# Patient Record
Sex: Female | Born: 1980 | Race: White | Hispanic: No | Marital: Single | Smoking: Former smoker
Health system: Southern US, Community
[De-identification: ages and names within clinical notes are randomized; demographics above are authoritative.]

## PROBLEM LIST (undated history)

## (undated) DIAGNOSIS — B009 Herpesviral infection, unspecified: Secondary | ICD-10-CM

## (undated) DIAGNOSIS — N390 Urinary tract infection, site not specified: Secondary | ICD-10-CM

## (undated) DIAGNOSIS — F329 Major depressive disorder, single episode, unspecified: Secondary | ICD-10-CM

## (undated) DIAGNOSIS — K219 Gastro-esophageal reflux disease without esophagitis: Secondary | ICD-10-CM

## (undated) DIAGNOSIS — K589 Irritable bowel syndrome without diarrhea: Secondary | ICD-10-CM

## (undated) DIAGNOSIS — I10 Essential (primary) hypertension: Secondary | ICD-10-CM

## (undated) DIAGNOSIS — L509 Urticaria, unspecified: Secondary | ICD-10-CM

## (undated) DIAGNOSIS — F32A Depression, unspecified: Secondary | ICD-10-CM

## (undated) DIAGNOSIS — F419 Anxiety disorder, unspecified: Secondary | ICD-10-CM

## (undated) HISTORY — DX: Irritable bowel syndrome, unspecified: K58.9

## (undated) HISTORY — DX: Gastro-esophageal reflux disease without esophagitis: K21.9

## (undated) HISTORY — DX: Urticaria, unspecified: L50.9

## (undated) HISTORY — PX: COLONOSCOPY: SHX174

---
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
## 1999-08-06 ENCOUNTER — Other Ambulatory Visit: Admission: RE | Admit: 1999-08-06 | Discharge: 1999-08-06 | Payer: Self-pay | Admitting: Family Medicine

## 2000-01-02 ENCOUNTER — Inpatient Hospital Stay (HOSPITAL_COMMUNITY): Admission: AD | Admit: 2000-01-02 | Discharge: 2000-01-02 | Payer: Self-pay | Admitting: Obstetrics & Gynecology

## 2000-04-10 ENCOUNTER — Inpatient Hospital Stay (HOSPITAL_COMMUNITY): Admission: AD | Admit: 2000-04-10 | Discharge: 2000-04-12 | Payer: Self-pay | Admitting: Obstetrics and Gynecology

## 2000-04-15 ENCOUNTER — Encounter: Admission: RE | Admit: 2000-04-15 | Discharge: 2000-05-12 | Payer: Self-pay | Admitting: Obstetrics and Gynecology

## 2000-10-04 ENCOUNTER — Emergency Department (HOSPITAL_COMMUNITY): Admission: EM | Admit: 2000-10-04 | Discharge: 2000-10-04 | Payer: Self-pay | Admitting: Emergency Medicine

## 2003-01-27 ENCOUNTER — Ambulatory Visit (HOSPITAL_COMMUNITY): Admission: RE | Admit: 2003-01-27 | Discharge: 2003-01-27 | Payer: Self-pay | Admitting: *Deleted

## 2003-01-27 ENCOUNTER — Encounter: Payer: Self-pay | Admitting: *Deleted

## 2003-02-20 ENCOUNTER — Ambulatory Visit (HOSPITAL_COMMUNITY): Admission: RE | Admit: 2003-02-20 | Discharge: 2003-02-20 | Payer: Self-pay | Admitting: Internal Medicine

## 2003-02-20 ENCOUNTER — Encounter: Payer: Self-pay | Admitting: Internal Medicine

## 2003-03-27 ENCOUNTER — Encounter: Admission: RE | Admit: 2003-03-27 | Discharge: 2003-03-27 | Payer: Self-pay | Admitting: Gastroenterology

## 2003-03-27 ENCOUNTER — Encounter: Payer: Self-pay | Admitting: Gastroenterology

## 2003-10-23 ENCOUNTER — Other Ambulatory Visit: Admission: RE | Admit: 2003-10-23 | Discharge: 2003-10-23 | Payer: Self-pay | Admitting: Family Medicine

## 2004-10-28 ENCOUNTER — Other Ambulatory Visit: Admission: RE | Admit: 2004-10-28 | Discharge: 2004-10-28 | Payer: Self-pay | Admitting: Family Medicine

## 2005-01-12 ENCOUNTER — Emergency Department (HOSPITAL_COMMUNITY): Admission: EM | Admit: 2005-01-12 | Discharge: 2005-01-12 | Payer: Self-pay | Admitting: Emergency Medicine

## 2005-01-15 ENCOUNTER — Ambulatory Visit (HOSPITAL_COMMUNITY): Admission: RE | Admit: 2005-01-15 | Discharge: 2005-01-15 | Payer: Self-pay | Admitting: Family Medicine

## 2005-05-13 ENCOUNTER — Encounter: Admission: RE | Admit: 2005-05-13 | Discharge: 2005-05-13 | Payer: Self-pay | Admitting: Family Medicine

## 2005-06-16 ENCOUNTER — Emergency Department (HOSPITAL_COMMUNITY): Admission: EM | Admit: 2005-06-16 | Discharge: 2005-06-16 | Payer: Self-pay | Admitting: *Deleted

## 2006-02-09 ENCOUNTER — Other Ambulatory Visit: Admission: RE | Admit: 2006-02-09 | Discharge: 2006-02-09 | Payer: Self-pay | Admitting: Family Medicine

## 2006-04-27 ENCOUNTER — Other Ambulatory Visit: Admission: RE | Admit: 2006-04-27 | Discharge: 2006-04-27 | Payer: Self-pay | Admitting: Family Medicine

## 2007-07-06 ENCOUNTER — Other Ambulatory Visit: Admission: RE | Admit: 2007-07-06 | Discharge: 2007-07-06 | Payer: Self-pay | Admitting: Family Medicine

## 2007-12-21 ENCOUNTER — Ambulatory Visit: Payer: Self-pay | Admitting: Family Medicine

## 2008-01-25 ENCOUNTER — Ambulatory Visit: Payer: Self-pay | Admitting: Family Medicine

## 2009-02-18 ENCOUNTER — Emergency Department (HOSPITAL_COMMUNITY): Admission: EM | Admit: 2009-02-18 | Discharge: 2009-02-19 | Payer: Self-pay | Admitting: Emergency Medicine

## 2010-11-04 ENCOUNTER — Encounter: Payer: Self-pay | Admitting: Emergency Medicine

## 2010-11-20 ENCOUNTER — Emergency Department: Payer: Self-pay | Admitting: Emergency Medicine

## 2011-01-21 LAB — URINE MICROSCOPIC-ADD ON

## 2011-01-21 LAB — DIFFERENTIAL: Neutro Abs: 6.6 10*3/uL (ref 1.7–7.7)

## 2011-01-21 LAB — COMPREHENSIVE METABOLIC PANEL
AST: 20 U/L (ref 0–37)
Calcium: 9.2 mg/dL (ref 8.4–10.5)
Creatinine, Ser: 0.81 mg/dL (ref 0.4–1.2)
Total Bilirubin: 0.6 mg/dL (ref 0.3–1.2)

## 2011-01-21 LAB — CBC
HCT: 44.2 % (ref 36.0–46.0)
MCV: 92.6 fL (ref 78.0–100.0)
Platelets: 230 10*3/uL (ref 150–400)
RBC: 4.78 MIL/uL (ref 3.87–5.11)
RDW: 14.1 % (ref 11.5–15.5)

## 2011-01-21 LAB — WET PREP, GENITAL
Trich, Wet Prep: NONE SEEN
Yeast Wet Prep HPF POC: NONE SEEN

## 2011-01-21 LAB — URINALYSIS, ROUTINE W REFLEX MICROSCOPIC: Leukocytes, UA: NEGATIVE

## 2011-01-21 LAB — GC/CHLAMYDIA PROBE AMP, GENITAL: GC Probe Amp, Genital: NEGATIVE

## 2011-02-01 ENCOUNTER — Emergency Department (HOSPITAL_COMMUNITY): Payer: 59

## 2011-02-01 ENCOUNTER — Emergency Department (HOSPITAL_COMMUNITY)
Admission: EM | Admit: 2011-02-01 | Discharge: 2011-02-01 | Disposition: A | Discharge status: Home / Self Care | Payer: 59 | Attending: Emergency Medicine | Admitting: Emergency Medicine

## 2011-02-01 DIAGNOSIS — R51 Headache: Secondary | ICD-10-CM | POA: Insufficient documentation

## 2011-02-01 DIAGNOSIS — S0003XA Contusion of scalp, initial encounter: Secondary | ICD-10-CM | POA: Insufficient documentation

## 2011-02-01 DIAGNOSIS — IMO0002 Reserved for concepts with insufficient information to code with codable children: Secondary | ICD-10-CM | POA: Insufficient documentation

## 2011-02-01 DIAGNOSIS — H538 Other visual disturbances: Secondary | ICD-10-CM | POA: Insufficient documentation

## 2011-02-01 DIAGNOSIS — H539 Unspecified visual disturbance: Secondary | ICD-10-CM | POA: Insufficient documentation

## 2011-02-01 DIAGNOSIS — S060X0A Concussion without loss of consciousness, initial encounter: Secondary | ICD-10-CM | POA: Insufficient documentation

## 2011-02-01 DIAGNOSIS — Z79899 Other long term (current) drug therapy: Secondary | ICD-10-CM | POA: Insufficient documentation

## 2011-02-01 DIAGNOSIS — W010XXA Fall on same level from slipping, tripping and stumbling without subsequent striking against object, initial encounter: Secondary | ICD-10-CM | POA: Insufficient documentation

## 2011-02-01 DIAGNOSIS — S1093XA Contusion of unspecified part of neck, initial encounter: Secondary | ICD-10-CM | POA: Insufficient documentation

## 2011-02-01 DIAGNOSIS — M542 Cervicalgia: Secondary | ICD-10-CM | POA: Insufficient documentation

## 2011-02-01 DIAGNOSIS — F411 Generalized anxiety disorder: Secondary | ICD-10-CM | POA: Insufficient documentation

## 2011-02-01 DIAGNOSIS — R110 Nausea: Secondary | ICD-10-CM | POA: Insufficient documentation

## 2011-02-01 DIAGNOSIS — Y929 Unspecified place or not applicable: Secondary | ICD-10-CM | POA: Insufficient documentation
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
## 2011-06-27 ENCOUNTER — Other Ambulatory Visit: Payer: Self-pay | Admitting: Gastroenterology

## 2011-06-27 DIAGNOSIS — R1110 Vomiting, unspecified: Secondary | ICD-10-CM

## 2011-07-09 ENCOUNTER — Ambulatory Visit
Admission: RE | Admit: 2011-07-09 | Discharge: 2011-07-09 | Disposition: A | Discharge status: Home / Self Care | Payer: 59 | Source: Ambulatory Visit | Attending: Gastroenterology | Admitting: Gastroenterology

## 2011-07-09 DIAGNOSIS — R1110 Vomiting, unspecified: Secondary | ICD-10-CM

## 2011-08-13 ENCOUNTER — Emergency Department (HOSPITAL_COMMUNITY)
Admission: EM | Admit: 2011-08-13 | Discharge: 2011-08-14 | Disposition: A | Discharge status: Home / Self Care | Payer: 59 | Attending: Emergency Medicine | Admitting: Emergency Medicine

## 2011-08-13 DIAGNOSIS — R9431 Abnormal electrocardiogram [ECG] [EKG]: Secondary | ICD-10-CM | POA: Insufficient documentation

## 2011-08-13 DIAGNOSIS — T50901A Poisoning by unspecified drugs, medicaments and biological substances, accidental (unintentional), initial encounter: Secondary | ICD-10-CM | POA: Insufficient documentation

## 2011-08-13 DIAGNOSIS — R112 Nausea with vomiting, unspecified: Secondary | ICD-10-CM | POA: Insufficient documentation

## 2011-08-13 DIAGNOSIS — R000 Tachycardia, unspecified: Secondary | ICD-10-CM | POA: Insufficient documentation

## 2011-08-13 DIAGNOSIS — T50904A Poisoning by unspecified drugs, medicaments and biological substances, undetermined, initial encounter: Secondary | ICD-10-CM | POA: Insufficient documentation

## 2011-08-13 LAB — RAPID URINE DRUG SCREEN, HOSP PERFORMED
Amphetamines: NOT DETECTED
Benzodiazepines: NOT DETECTED
Cocaine: NOT DETECTED

## 2011-08-13 LAB — DIFFERENTIAL
Basophils Absolute: 0 10*3/uL (ref 0.0–0.1)
Basophils Relative: 0 % (ref 0–1)
Eosinophils Absolute: 0.1 10*3/uL (ref 0.0–0.7)
Eosinophils Relative: 1 % (ref 0–5)
Lymphocytes Relative: 27 % (ref 12–46)
Lymphs Abs: 2.8 10*3/uL (ref 0.7–4.0)
Monocytes Absolute: 0.8 10*3/uL (ref 0.1–1.0)
Monocytes Relative: 7 % (ref 3–12)
Neutro Abs: 6.6 10*3/uL (ref 1.7–7.7)
Neutrophils Relative %: 64 % (ref 43–77)

## 2011-08-13 LAB — COMPREHENSIVE METABOLIC PANEL
ALT: 10 U/L (ref 0–35)
Albumin: 3.3 g/dL — ABNORMAL LOW (ref 3.5–5.2)
Alkaline Phosphatase: 54 U/L (ref 39–117)
BUN: 4 mg/dL — ABNORMAL LOW (ref 6–23)
CO2: 23 mEq/L (ref 19–32)
Creatinine, Ser: 0.65 mg/dL (ref 0.50–1.10)
Potassium: 3.3 mEq/L — ABNORMAL LOW (ref 3.5–5.1)

## 2011-08-13 LAB — CBC
HCT: 38.2 % (ref 36.0–46.0)
Hemoglobin: 13 g/dL (ref 12.0–15.0)
MCH: 32.9 pg (ref 26.0–34.0)
MCHC: 34 g/dL (ref 30.0–36.0)
MCV: 96.7 fL (ref 78.0–100.0)
Platelets: 225 10*3/uL (ref 150–400)
RBC: 3.95 MIL/uL (ref 3.87–5.11)
RDW: 13.8 % (ref 11.5–15.5)
WBC: 10.4 10*3/uL (ref 4.0–10.5)

## 2011-08-13 LAB — URINALYSIS, ROUTINE W REFLEX MICROSCOPIC
Bilirubin Urine: NEGATIVE
Ketones, ur: NEGATIVE mg/dL
Nitrite: NEGATIVE
Protein, ur: NEGATIVE mg/dL
Specific Gravity, Urine: 1.006 (ref 1.005–1.030)

## 2011-08-13 LAB — ACETAMINOPHEN LEVEL: Acetaminophen (Tylenol), Serum: <15 ug/mL (ref 10–30)

## 2011-08-13 LAB — URINE MICROSCOPIC-ADD ON

## 2011-08-13 LAB — SALICYLATE LEVEL: Salicylate Lvl: <2 mg/dL — ABNORMAL LOW (ref 2.8–20.0)

## 2011-08-13 LAB — ETHANOL: Alcohol, Ethyl (B): 104 mg/dL — ABNORMAL HIGH (ref 0–11)

## 2011-08-13 LAB — PREGNANCY, URINE: Preg Test, Ur: NEGATIVE

## 2011-08-31 ENCOUNTER — Emergency Department (HOSPITAL_COMMUNITY): Payer: 59

## 2011-08-31 ENCOUNTER — Emergency Department (HOSPITAL_COMMUNITY)
Admission: EM | Admit: 2011-08-31 | Discharge: 2011-08-31 | Disposition: A | Discharge status: Home / Self Care | Payer: 59 | Attending: Emergency Medicine | Admitting: Emergency Medicine

## 2011-08-31 ENCOUNTER — Encounter: Payer: Self-pay | Admitting: *Deleted

## 2011-08-31 DIAGNOSIS — J3489 Other specified disorders of nose and nasal sinuses: Secondary | ICD-10-CM | POA: Insufficient documentation

## 2011-08-31 DIAGNOSIS — R42 Dizziness and giddiness: Secondary | ICD-10-CM | POA: Insufficient documentation

## 2011-08-31 DIAGNOSIS — R51 Headache: Secondary | ICD-10-CM | POA: Insufficient documentation

## 2011-08-31 DIAGNOSIS — R000 Tachycardia, unspecified: Secondary | ICD-10-CM | POA: Insufficient documentation

## 2011-08-31 DIAGNOSIS — IMO0001 Reserved for inherently not codable concepts without codable children: Secondary | ICD-10-CM | POA: Insufficient documentation

## 2011-08-31 DIAGNOSIS — J343 Hypertrophy of nasal turbinates: Secondary | ICD-10-CM | POA: Insufficient documentation

## 2011-08-31 DIAGNOSIS — F411 Generalized anxiety disorder: Secondary | ICD-10-CM | POA: Insufficient documentation

## 2011-08-31 DIAGNOSIS — R05 Cough: Secondary | ICD-10-CM | POA: Insufficient documentation

## 2011-08-31 DIAGNOSIS — R112 Nausea with vomiting, unspecified: Secondary | ICD-10-CM | POA: Insufficient documentation

## 2011-08-31 DIAGNOSIS — R5381 Other malaise: Secondary | ICD-10-CM | POA: Insufficient documentation

## 2011-08-31 DIAGNOSIS — E860 Dehydration: Secondary | ICD-10-CM | POA: Insufficient documentation

## 2011-08-31 DIAGNOSIS — R059 Cough, unspecified: Secondary | ICD-10-CM | POA: Insufficient documentation

## 2011-08-31 DIAGNOSIS — J069 Acute upper respiratory infection, unspecified: Secondary | ICD-10-CM | POA: Insufficient documentation

## 2011-08-31 DIAGNOSIS — R070 Pain in throat: Secondary | ICD-10-CM | POA: Insufficient documentation

## 2011-08-31 LAB — URINALYSIS, ROUTINE W REFLEX MICROSCOPIC
Glucose, UA: NEGATIVE mg/dL
Glucose, UA: NEGATIVE mg/dL
Ketones, ur: NEGATIVE mg/dL
Nitrite: NEGATIVE
Protein, ur: NEGATIVE mg/dL
Protein, ur: NEGATIVE mg/dL
Specific Gravity, Urine: 1.013 (ref 1.005–1.030)
Urobilinogen, UA: 0.2 mg/dL (ref 0.0–1.0)
pH: 6.5 (ref 5.0–8.0)

## 2011-08-31 LAB — URINE MICROSCOPIC-ADD ON

## 2011-08-31 LAB — POCT PREGNANCY, URINE: Preg Test, Ur: NEGATIVE

## 2011-08-31 LAB — POCT I-STAT, CHEM 8
BUN: 5 mg/dL — ABNORMAL LOW (ref 6–23)
Chloride: 106 mEq/L (ref 96–112)
Creatinine, Ser: 0.9 mg/dL (ref 0.50–1.10)
Potassium: 3 mEq/L — ABNORMAL LOW (ref 3.5–5.1)
Sodium: 140 mEq/L (ref 135–145)

## 2011-08-31 MED ORDER — POTASSIUM CHLORIDE CRYS ER 20 MEQ PO TBCR
40.0000 meq | EXTENDED_RELEASE_TABLET | Freq: Once | ORAL | Status: AC
  Administered 2011-08-31: 40 meq via ORAL
  Filled 2011-08-31: qty 1

## 2011-08-31 MED ORDER — SODIUM CHLORIDE 0.9 % IV BOLUS (SEPSIS)
1000.0000 mL | Freq: Once | INTRAVENOUS | Status: AC
  Administered 2011-08-31: 1000 mL via INTRAVENOUS

## 2011-08-31 MED ORDER — ONDANSETRON HCL 4 MG/2ML IJ SOLN
4.0000 mg | Freq: Once | INTRAMUSCULAR | Status: AC
  Administered 2011-08-31: 4 mg via INTRAVENOUS
  Filled 2011-08-31: qty 2

## 2011-08-31 MED ORDER — KETOROLAC TROMETHAMINE 30 MG/ML IJ SOLN
30.0000 mg | Freq: Once | INTRAMUSCULAR | Status: AC
  Administered 2011-08-31: 30 mg via INTRAVENOUS
  Filled 2011-08-31: qty 1

## 2011-08-31 NOTE — ED Provider Notes (Signed)
History     CSN: [id number] Arrival date & time: 08/31/2011  6:48 PM   First MD Initiated Contact with Patient 08/31/11 1932      Chief Complaint  Patient presents with  . Generalized Body Aches  . URI    (Consider location/radiation/quality/duration/timing/severity/associated sxs/prior treatment) HPI The patient is a 30 year old female who presents today for evaluation of upper respiratory symptoms as well as generalized aches and pains. Patient had one episode of nausea and vomiting. Patient also felt lightheaded medially prior to being brought back for evaluation here.  She was afebrile but initially tachycardic to the 110-120. Patient describes having coughing as well as nasal congestion, sore throat, and mild headache. Her one episode of emesis was following coughing.  She does not know of any sick contacts and says her temperature was as high as 100.5 at home yesterday evening. There no other associated or modifying factors. History reviewed. No pertinent past medical history.  History reviewed. No pertinent past surgical history.  History reviewed. No pertinent family history.  History  Substance Use Topics  . Smoking status: Current Everyday Smoker -- 1.0 packs/day    Types: Cigarettes  . Smokeless tobacco: Not on file  . Alcohol Use: Yes     daily    OB History    Grav Para Term Preterm Abortions TAB SAB Ect Mult Living                  Review of Systems  Constitutional: Positive for fever and fatigue.  HENT: Positive for congestion and sore throat.   Eyes: Negative.   Respiratory: Positive for cough.   Cardiovascular: Negative.   Gastrointestinal: Positive for nausea and vomiting.  Genitourinary: Negative.   Musculoskeletal: Positive for myalgias.  Skin: Negative.  Negative for rash.  Neurological: Positive for headaches.  Hematological: Negative.   Psychiatric/Behavioral: Negative.   All other systems reviewed and are negative.    Allergies  Review  of patient's allergies indicates no known allergies.  Home Medications   Current Outpatient Rx  Name Route Sig Dispense Refill  . CLONAZEPAM 1 MG PO TABS Oral Take 1 mg by mouth 3 (three) times daily as needed.      Marland Kitchen DICYCLOMINE HCL 20 MG PO TABS Oral Take 20 mg by mouth daily.      Marland Kitchen OMEPRAZOLE 20 MG PO CPDR Oral Take 20 mg by mouth 2 (two) times daily.        BP 128/68  Pulse 72  Temp(Src) 99.5 F (37.5 C) (Oral)  Resp 30  SpO2 100%  Physical Exam  Nursing note and vitals reviewed. Constitutional: She is oriented to person, place, and time. She appears well-developed and well-nourished. No distress.  HENT:  Head: Normocephalic and atraumatic.  Nose: Mucosal edema present.  Eyes: Conjunctivae and EOM are normal. Pupils are equal, round, and reactive to light.  Neck: Normal range of motion.  Cardiovascular: Normal rate, regular rhythm and normal heart sounds.  Exam reveals no gallop and no friction rub.   No murmur heard. Pulmonary/Chest: Effort normal and breath sounds normal. No respiratory distress. She has no wheezes. She has no rales.  Abdominal: Soft. Bowel sounds are normal. She exhibits no distension. There is no tenderness. There is no rebound and no guarding.  Musculoskeletal: Normal range of motion.  Neurological: She is alert and oriented to person, place, and time. No cranial nerve deficit. She exhibits normal muscle tone. Coordination normal.  Skin: Skin is warm and dry. No rash  noted.  Psychiatric:       Anxious    ED Course  Procedures (including critical care time)  Labs Reviewed  URINALYSIS, ROUTINE W REFLEX MICROSCOPIC - Abnormal; Notable for the following:    Appearance CLOUDY (*)    Hgb urine dipstick MODERATE (*)    Leukocytes, UA MODERATE (*)    All other components within normal limits  URINE MICROSCOPIC-ADD ON - Abnormal; Notable for the following:    Squamous Epithelial / LPF MANY (*)    Bacteria, UA MANY (*)    Casts HYALINE CASTS (*)     All other components within normal limits  POCT I-STAT, CHEM 8 - Abnormal; Notable for the following:    Potassium 3.0 (*)    BUN 5 (*)    Calcium, Ion 1.03 (*)    All other components within normal limits  URINALYSIS, ROUTINE W REFLEX MICROSCOPIC - Abnormal; Notable for the following:    Hgb urine dipstick SMALL (*)    Ketones, ur 15 (*)    Leukocytes, UA TRACE (*)    All other components within normal limits  URINE MICROSCOPIC-ADD ON - Abnormal; Notable for the following:    Squamous Epithelial / LPF FEW (*)    Bacteria, UA FEW (*)    Casts HYALINE CASTS (*)    All other components within normal limits  POCT PREGNANCY, URINE  I-STAT, CHEM 8   Dg Chest 2 View  08/31/2011  *RADIOLOGY REPORT*  Clinical Data: Chest pain.  Cough.  Shortness of breath.  Fever.  CHEST - 2 VIEW  Comparison: 01/15/2005  Findings: Cardiac and mediastinal contours appear normal.  The lungs appear clear.  No pleural effusion is identified.  IMPRESSION:  No significant abnormality identified.  Original Report Authenticated By: Dellia Cloud, M.D.     1. URI, acute   2. Dehydration       MDM  The patient was evaluated by myself. Given her symptoms patient did have a chest x-ray. This showed no signs of pneumonia. Patient also had urinalysis and urine pregnancy test. Urine pregnancy was negative. Initial urinalysis was contaminated. Patient had repeat performed. This did not show any signs of infection. There were some signs of dehydration. Patient also had a renal panel. She had some mild hypokalemia. This was replaced orally. Patient received a liter of normal saline IV bolus as well as 30 mg of Toradol IV. With this she felt better. Patient was told that she can continue to take over-the-counter medications for her symptoms. She is welcome to return if she has any other emergent concerns. She was encouraged to drink plenty of fluids.        Cyndra Numbers, MD 08/31/11 [id number]

## 2011-08-31 NOTE — ED Notes (Signed)
Pt has been having body aches, URI symptoms.  Pt has also been having fever and chills.  Pt has been having a severe HA with this also and has intermittently been feeling like she will have to pass out.  Pt vomiting in triage.  No sob

## 2011-08-31 NOTE — ED Notes (Signed)
Patient transported to X-ray 

## 2011-10-23 ENCOUNTER — Telehealth (INDEPENDENT_AMBULATORY_CARE_PROVIDER_SITE_OTHER): Payer: Self-pay | Admitting: Surgery

## 2011-10-23 NOTE — Telephone Encounter (Signed)
This patient is scheduled to see Dr. Magnus Ivan for a polyp in the GB.  She is having problems getting her records from her doctor, they say they will not release her records unless she has a physical which she has already scheduled.  My question is can you pull the information you need from the system or does her doctor's office have to fax it in?

## 2011-11-11 ENCOUNTER — Ambulatory Visit (INDEPENDENT_AMBULATORY_CARE_PROVIDER_SITE_OTHER): Payer: 59 | Admitting: Surgery

## 2011-11-11 ENCOUNTER — Encounter (INDEPENDENT_AMBULATORY_CARE_PROVIDER_SITE_OTHER): Payer: Self-pay | Admitting: Surgery

## 2011-11-11 VITALS — BP 126/90 | HR 110 | Temp 98.4°F | Resp 18 | Ht 64.0 in | Wt 145.0 lb

## 2011-11-11 DIAGNOSIS — K8020 Calculus of gallbladder without cholecystitis without obstruction: Secondary | ICD-10-CM

## 2011-11-11 NOTE — Progress Notes (Signed)
Patient ID: Nicole [name], female   DOB: January 04, 1981, 31 y.o.   MRN: [id number]  Chief Complaint  Patient presents with  . Abdominal Pain    new pt gallstones    HPI Nicole [name] is a 31 y.o. female.She is a self referral. For over a year now she has been having intermittent epigastric and right upper quadrant abdominal pain nausea and projectile vomiting. It occurs at different random times but occasionally is associated with fatty meals. She denies any jaundice. She has had a workup including upper endoscopy and ultrasound. The pain can be sharp in nature. They can also be cramping. HPI  Past Medical History  Diagnosis Date  . GERD (gastroesophageal reflux disease)   . IBS (irritable bowel syndrome)     Past Surgical History  Procedure Date  . Colonoscopy     Family History  Problem Relation Age of Onset  . Hypertension Father   . Cancer Maternal Uncle     esophageal  . Hypertension Maternal Grandmother     Social History History  Substance Use Topics  . Smoking status: Current Everyday Smoker -- 1.0 packs/day    Types: Cigarettes  . Smokeless tobacco: Not on file  . Alcohol Use: Yes     daily    No Known Allergies  Current Outpatient Prescriptions  Medication Sig Dispense Refill  . clonazePAM (KLONOPIN) 1 MG tablet Take 1 mg by mouth 3 (three) times daily as needed.        . dicyclomine (BENTYL) 20 MG tablet Take 20 mg by mouth daily.        . diphenoxylate-atropine (LOMOTIL) 2.5-0.025 MG per tablet Take 1 tablet by mouth 4 (four) times daily as needed.      Marland Kitchen GILDESS FE 1.5/30 1.5-30 MG-MCG tablet daily.      . LamoTRIgine (LAMICTAL ODT) 50 MG TBDP Take by mouth as directed.      . lamoTRIgine (LAMICTAL) 100 MG tablet Take 100 mg by mouth as directed.      . lamoTRIgine (LAMICTAL) 25 MG tablet Take 25 mg by mouth as directed.      . nitrofurantoin, macrocrystal-monohydrate, (MACROBID) 100 MG capsule Ad lib.      Marland Kitchen omeprazole (PRILOSEC) 20 MG capsule Take 20 mg  by mouth 2 (two) times daily.        . ondansetron (ZOFRAN) 8 MG tablet Ad lib.      . pantoprazole (PROTONIX) 40 MG tablet daily.      . risperiDONE (RISPERDAL) 1 MG tablet daily.        Review of Systems Review of Systems  Constitutional: Negative for fever, chills and unexpected weight change.  HENT: Negative for hearing loss, congestion, sore throat, trouble swallowing and voice change.   Eyes: Negative for visual disturbance.  Respiratory: Negative for cough and wheezing.   Cardiovascular: Negative for chest pain, palpitations and leg swelling.  Gastrointestinal: Positive for nausea, vomiting, abdominal pain, diarrhea and abdominal distention. Negative for constipation, blood in stool and anal bleeding.  Genitourinary: Negative for hematuria, vaginal bleeding and difficulty urinating.  Musculoskeletal: Negative for arthralgias.  Skin: Negative for rash and wound.  Neurological: Negative for seizures, syncope and headaches.  Hematological: Negative for adenopathy. Does not bruise/bleed easily.  Psychiatric/Behavioral: Negative for confusion.    Blood pressure 126/90, pulse 110, temperature 98.4 F (36.9 C), temperature source Temporal, resp. rate 18, height 5\' 4"  (1.626 m), weight 145 lb (65.772 kg).  Physical Exam Physical Exam  Constitutional: She is oriented to  person, place, and time. She appears well-developed and well-nourished. No distress.  HENT:  Head: Normocephalic and atraumatic.  Right Ear: External ear normal.  Left Ear: External ear normal.  Nose: Nose normal.  Mouth/Throat: Oropharynx is clear and moist. No oropharyngeal exudate.  Eyes: Conjunctivae are normal. Pupils are equal, round, and reactive to light. Right eye exhibits no discharge. Left eye exhibits no discharge. No scleral icterus.  Neck: Normal range of motion. Neck supple. No tracheal deviation present. No thyromegaly present.  Cardiovascular: Normal rate, regular rhythm, normal heart sounds and  intact distal pulses.   No murmur heard. Pulmonary/Chest: Effort normal and breath sounds normal. No respiratory distress. She has no wheezes. She has no rales.  Abdominal: Soft. Bowel sounds are normal. There is tenderness. There is guarding.       Tenderness and guarding or in the right upper quadrant  Musculoskeletal: Normal range of motion. She exhibits no edema and no tenderness.  Lymphadenopathy:    She has no cervical adenopathy.  Neurological: She is alert and oriented to person, place, and time.  Skin: Skin is warm and dry. No rash noted. She is not diaphoretic. No erythema.  Psychiatric: Her behavior is normal. Judgment normal.    Data Reviewed Have an ultrasound showing her to have a gallbladder polyp. She has had an ultrasound in the past was suggest a stone  Assessment    Symptomatic cholelithiasis and possible chronic cholecystitis    Plan    At this point she wishes to proceed with cholecystectomy which I feel is very reasonable. She does have a lot of stressors which may be contributing to the problem. I agree however that laparoscopic cholecystectomy is needed. I discussed the procedure with her in detail. I gave her literature regarding it. We discussed the risk of surgery which includes but is not limited to bleeding, infection, injury to shrink structures, bile leak, bile duct injury, need to convert to an open procedure, and the chance this may not resolve any of her symptoms. She understands and wishes to proceed. Likelihood of success is good       Bram Hottel A 11/11/2011, 11:51 AM

## 2011-11-12 ENCOUNTER — Encounter (INDEPENDENT_AMBULATORY_CARE_PROVIDER_SITE_OTHER): Payer: Self-pay

## 2011-11-13 ENCOUNTER — Telehealth (INDEPENDENT_AMBULATORY_CARE_PROVIDER_SITE_OTHER): Payer: Self-pay | Admitting: General Surgery

## 2011-11-13 NOTE — Telephone Encounter (Signed)
Patient called with questions about FMLA and short term disability process. She also had some questions about speaking with anesthesia prior to surgery and what happens at pre-surgical testing.  I answered her questions and she will call back if she has anymore.

## 2011-11-26 ENCOUNTER — Encounter (HOSPITAL_COMMUNITY): Payer: Self-pay | Admitting: Pharmacy Technician

## 2011-11-26 ENCOUNTER — Encounter (HOSPITAL_COMMUNITY)
Admission: RE | Admit: 2011-11-26 | Discharge: 2011-11-26 | Disposition: A | Discharge status: Home / Self Care | Payer: 59 | Source: Ambulatory Visit | Attending: Surgery | Admitting: Surgery

## 2011-11-26 ENCOUNTER — Encounter (HOSPITAL_COMMUNITY): Payer: Self-pay

## 2011-11-26 HISTORY — DX: Anxiety disorder, unspecified: F41.9

## 2011-11-26 HISTORY — DX: Urinary tract infection, site not specified: N39.0

## 2011-11-26 HISTORY — DX: Major depressive disorder, single episode, unspecified: F32.9

## 2011-11-26 HISTORY — DX: Depression, unspecified: F32.A

## 2011-11-26 HISTORY — DX: Herpesviral infection, unspecified: B00.9

## 2011-11-26 LAB — COMPREHENSIVE METABOLIC PANEL
ALT: 8 U/L (ref 0–35)
AST: 14 U/L (ref 0–37)
Albumin: 3.2 g/dL — ABNORMAL LOW (ref 3.5–5.2)
Alkaline Phosphatase: 60 U/L (ref 39–117)
Calcium: 8.9 mg/dL (ref 8.4–10.5)
Potassium: 3.8 mEq/L (ref 3.5–5.1)
Sodium: 139 mEq/L (ref 135–145)
Total Protein: 6.3 g/dL (ref 6.0–8.3)

## 2011-11-26 LAB — CBC
HCT: 35.4 % — ABNORMAL LOW (ref 36.0–46.0)
MCHC: 33.9 g/dL (ref 30.0–36.0)
Platelets: 249 10*3/uL (ref 150–400)
RDW: 16.7 % — ABNORMAL HIGH (ref 11.5–15.5)
WBC: 10.7 10*3/uL — ABNORMAL HIGH (ref 4.0–10.5)

## 2011-11-26 LAB — HCG, SERUM, QUALITATIVE: Preg, Serum: NEGATIVE

## 2011-11-26 LAB — SURGICAL PCR SCREEN: Staphylococcus aureus: NEGATIVE

## 2011-11-26 NOTE — Pre-Procedure Instructions (Signed)
[address] RENEISHA STILLEY  11/26/2011   Your procedure is scheduled on:  12/05/11  Report to [facility] at 5:30 AM.  Call this number if you have problems the morning of surgery: [phone]   Remember: Discontinue Aspirin, Coumadin, Plavix, Effient and herbal medications.   Do not eat food:After Midnight.  May have clear liquids: up to 4 Hours before arrival (1:30 AM).  Clear liquids include soda, tea, black coffee, apple or grape juice, broth.  Take these medicines the morning of surgery with A SIP OF WATER: Klonopin, Gildess, Lamictal, Risperdal, Prilosec   Do not wear jewelry, make-up or nail polish.  Do not wear lotions, powders, or perfumes. You may wear deodorant.  Do not shave 48 hours prior to surgery.  Do not bring valuables to the hospital.  Contacts, dentures or bridgework may not be worn into surgery.  Leave suitcase in the car. After surgery it may be brought to your room.  For patients admitted to the hospital, checkout time is 11:00 AM the day of discharge.   Patients discharged the day of surgery will not be allowed to drive home.  Name and phone number of your driver: mother Maija Biggers [phone]  Special Instructions: CHG Shower Use Special Wash: 1/2 bottle night before surgery and 1/2 bottle morning of surgery.   Please read over the following fact sheets that you were given: Pain Booklet, Coughing and Deep Breathing, MRSA Information and Surgical Site Infection Prevention

## 2011-12-04 MED ORDER — CEFAZOLIN SODIUM 1-5 GM-% IV SOLN
1.0000 g | INTRAVENOUS | Status: AC
  Administered 2011-12-05: 1 g via INTRAVENOUS
  Filled 2011-12-04: qty 50

## 2011-12-05 ENCOUNTER — Encounter (HOSPITAL_COMMUNITY)
Admission: RE | Disposition: A | Discharge status: Home / Self Care | Payer: Self-pay | Source: Ambulatory Visit | Attending: Surgery

## 2011-12-05 ENCOUNTER — Ambulatory Visit (HOSPITAL_COMMUNITY)
Admission: RE | Admit: 2011-12-05 | Discharge: 2011-12-05 | Disposition: A | Discharge status: Home / Self Care | Payer: 59 | Source: Ambulatory Visit | Attending: Surgery | Admitting: Surgery

## 2011-12-05 ENCOUNTER — Other Ambulatory Visit (INDEPENDENT_AMBULATORY_CARE_PROVIDER_SITE_OTHER): Payer: Self-pay | Admitting: Surgery

## 2011-12-05 ENCOUNTER — Encounter (HOSPITAL_COMMUNITY): Payer: Self-pay | Admitting: Anesthesiology

## 2011-12-05 ENCOUNTER — Encounter (HOSPITAL_COMMUNITY): Payer: Self-pay

## 2011-12-05 ENCOUNTER — Ambulatory Visit (HOSPITAL_COMMUNITY): Payer: 59 | Admitting: Anesthesiology

## 2011-12-05 DIAGNOSIS — K8010 Calculus of gallbladder with chronic cholecystitis without obstruction: Secondary | ICD-10-CM

## 2011-12-05 DIAGNOSIS — K219 Gastro-esophageal reflux disease without esophagitis: Secondary | ICD-10-CM | POA: Insufficient documentation

## 2011-12-05 DIAGNOSIS — F17200 Nicotine dependence, unspecified, uncomplicated: Secondary | ICD-10-CM | POA: Insufficient documentation

## 2011-12-05 DIAGNOSIS — Z01812 Encounter for preprocedural laboratory examination: Secondary | ICD-10-CM | POA: Insufficient documentation

## 2011-12-05 HISTORY — PX: CHOLECYSTECTOMY: SHX55

## 2011-12-05 SURGERY — LAPAROSCOPIC CHOLECYSTECTOMY
Anesthesia: General | Site: Abdomen | Wound class: Clean Contaminated

## 2011-12-05 MED ORDER — LIDOCAINE HCL (CARDIAC) 20 MG/ML IV SOLN
INTRAVENOUS | Status: DC | PRN
  Administered 2011-12-05: 100 mg via INTRAVENOUS

## 2011-12-05 MED ORDER — PROMETHAZINE HCL 25 MG/ML IJ SOLN: 12.5000 mg | Freq: Four times a day (QID) | INTRAMUSCULAR | Status: DC | PRN

## 2011-12-05 MED ORDER — ONDANSETRON HCL 4 MG PO TABS: 4.0000 mg | ORAL_TABLET | Freq: Three times a day (TID) | ORAL | Status: AC | PRN

## 2011-12-05 MED ORDER — SODIUM CHLORIDE 0.9 % IJ SOLN: 3.0000 mL | INTRAMUSCULAR | Status: DC | PRN

## 2011-12-05 MED ORDER — FENTANYL CITRATE 0.05 MG/ML IJ SOLN
INTRAMUSCULAR | Status: DC | PRN
  Administered 2011-12-05: 150 ug via INTRAVENOUS
  Administered 2011-12-05 (×2): 50 ug via INTRAVENOUS

## 2011-12-05 MED ORDER — MORPHINE SULFATE 2 MG/ML IJ SOLN: 2.0000 mg | INTRAMUSCULAR | Status: DC | PRN

## 2011-12-05 MED ORDER — MIDAZOLAM HCL 5 MG/5ML IJ SOLN
INTRAMUSCULAR | Status: DC | PRN
  Administered 2011-12-05: 2 mg via INTRAVENOUS

## 2011-12-05 MED ORDER — NEOSTIGMINE METHYLSULFATE 1 MG/ML IJ SOLN
INTRAMUSCULAR | Status: DC | PRN
  Administered 2011-12-05: 2 mg via INTRAVENOUS

## 2011-12-05 MED ORDER — HYDROMORPHONE HCL PF 1 MG/ML IJ SOLN
0.2500 mg | INTRAMUSCULAR | Status: DC | PRN
  Administered 2011-12-05 (×4): 0.5 mg via INTRAVENOUS

## 2011-12-05 MED ORDER — ROCURONIUM BROMIDE 100 MG/10ML IV SOLN
INTRAVENOUS | Status: DC | PRN
  Administered 2011-12-05: 10 mg via INTRAVENOUS

## 2011-12-05 MED ORDER — ONDANSETRON HCL 4 MG/2ML IJ SOLN: 4.0000 mg | Freq: Once | INTRAMUSCULAR | Status: DC | PRN

## 2011-12-05 MED ORDER — 0.9 % SODIUM CHLORIDE (POUR BTL) OPTIME
TOPICAL | Status: DC | PRN
  Administered 2011-12-05: 1000 mL

## 2011-12-05 MED ORDER — GLYCOPYRROLATE 0.2 MG/ML IJ SOLN
INTRAMUSCULAR | Status: DC | PRN
  Administered 2011-12-05: .2 mg via INTRAVENOUS

## 2011-12-05 MED ORDER — ONDANSETRON HCL 4 MG/2ML IJ SOLN: 4.0000 mg | Freq: Four times a day (QID) | INTRAMUSCULAR | Status: DC | PRN

## 2011-12-05 MED ORDER — SODIUM CHLORIDE 0.9 % IV SOLN: 250.0000 mL | INTRAVENOUS | Status: DC | PRN

## 2011-12-05 MED ORDER — HYDROCODONE-ACETAMINOPHEN 5-325 MG PO TABS: 1.0000 | ORAL_TABLET | ORAL | Status: AC | PRN

## 2011-12-05 MED ORDER — BUPIVACAINE-EPINEPHRINE 0.25% -1:200000 IJ SOLN
INTRAMUSCULAR | Status: DC | PRN
  Administered 2011-12-05: 20 mL

## 2011-12-05 MED ORDER — MEPERIDINE HCL 25 MG/ML IJ SOLN
6.2500 mg | INTRAMUSCULAR | Status: DC | PRN
Start: 2011-12-05 — End: 2011-12-05

## 2011-12-05 MED ORDER — ACETAMINOPHEN 650 MG RE SUPP
650.0000 mg | RECTAL | Status: DC | PRN
  Filled 2011-12-05: qty 1

## 2011-12-05 MED ORDER — SUCCINYLCHOLINE CHLORIDE 20 MG/ML IJ SOLN
INTRAMUSCULAR | Status: DC | PRN
  Administered 2011-12-05: 100 mg via INTRAVENOUS

## 2011-12-05 MED ORDER — SODIUM CHLORIDE 0.9 % IJ SOLN: 3.0000 mL | Freq: Two times a day (BID) | INTRAMUSCULAR | Status: DC

## 2011-12-05 MED ORDER — ONDANSETRON HCL 4 MG/2ML IJ SOLN
INTRAMUSCULAR | Status: DC | PRN
  Administered 2011-12-05: 4 mg via INTRAVENOUS

## 2011-12-05 MED ORDER — MORPHINE SULFATE 2 MG/ML IJ SOLN: 0.0500 mg/kg | INTRAMUSCULAR | Status: DC | PRN

## 2011-12-05 MED ORDER — OXYCODONE HCL 5 MG PO TABS
5.0000 mg | ORAL_TABLET | ORAL | Status: DC | PRN
  Administered 2011-12-05: 5 mg via ORAL
  Filled 2011-12-05: qty 2

## 2011-12-05 MED ORDER — KETOROLAC TROMETHAMINE 30 MG/ML IJ SOLN
INTRAMUSCULAR | Status: DC | PRN
  Administered 2011-12-05: 30 mg via INTRAVENOUS

## 2011-12-05 MED ORDER — LACTATED RINGERS IV SOLN
INTRAVENOUS | Status: DC | PRN
  Administered 2011-12-05 (×2): via INTRAVENOUS

## 2011-12-05 MED ORDER — SODIUM CHLORIDE 0.9 % IR SOLN
Status: DC | PRN
  Administered 2011-12-05: 1000 mL

## 2011-12-05 MED ORDER — ACETAMINOPHEN 325 MG PO TABS
650.0000 mg | ORAL_TABLET | ORAL | Status: DC | PRN
  Filled 2011-12-05: qty 2

## 2011-12-05 MED ORDER — PROPOFOL 10 MG/ML IV EMUL
INTRAVENOUS | Status: DC | PRN
  Administered 2011-12-05: 110 mg via INTRAVENOUS
  Administered 2011-12-05: 20 mg via INTRAVENOUS

## 2011-12-05 SURGICAL SUPPLY — 44 items
APL SKNCLS STERI-STRIP NONHPOA (GAUZE/BANDAGES/DRESSINGS)
APPLIER CLIP 5 13 M/L LIGAMAX5 (MISCELLANEOUS) ×2
APR CLP MED LRG 5 ANG JAW (MISCELLANEOUS) ×1
BAG SPEC RTRVL LRG 6X4 10 (ENDOMECHANICALS)
BANDAGE ADHESIVE 1X3 (GAUZE/BANDAGES/DRESSINGS) ×4 IMPLANT
BENZOIN TINCTURE PRP APPL 2/3 (GAUZE/BANDAGES/DRESSINGS) ×1 IMPLANT
CANISTER SUCTION 2500CC (MISCELLANEOUS) ×2 IMPLANT
CHLORAPREP W/TINT 26ML (MISCELLANEOUS) ×2 IMPLANT
CLIP APPLIE 5 13 M/L LIGAMAX5 (MISCELLANEOUS) ×1 IMPLANT
CLOTH BEACON ORANGE TIMEOUT ST (SAFETY) ×2 IMPLANT
COVER MAYO STAND STRL (DRAPES) IMPLANT
COVER SURGICAL LIGHT HANDLE (MISCELLANEOUS) ×2 IMPLANT
DECANTER SPIKE VIAL GLASS SM (MISCELLANEOUS) ×2 IMPLANT
DRAPE C-ARM 42X72 X-RAY (DRAPES) IMPLANT
ELECT REM PT RETURN 9FT ADLT (ELECTROSURGICAL) ×2
ELECTRODE REM PT RTRN 9FT ADLT (ELECTROSURGICAL) ×1 IMPLANT
GLOVE BIOGEL PI IND STRL 6.5 (GLOVE) IMPLANT
GLOVE BIOGEL PI IND STRL 7.0 (GLOVE) IMPLANT
GLOVE BIOGEL PI IND STRL 7.5 (GLOVE) IMPLANT
GLOVE BIOGEL PI INDICATOR 6.5 (GLOVE) ×1
GLOVE BIOGEL PI INDICATOR 7.0 (GLOVE) ×1
GLOVE BIOGEL PI INDICATOR 7.5 (GLOVE) ×1
GLOVE ECLIPSE 7.0 STRL STRAW (GLOVE) ×2 IMPLANT
GLOVE SURG SIGNA 7.5 PF LTX (GLOVE) ×2 IMPLANT
GLOVE SURG SS PI 6.5 STRL IVOR (GLOVE) ×2 IMPLANT
GOWN PREVENTION PLUS XLARGE (GOWN DISPOSABLE) ×2 IMPLANT
GOWN STRL NON-REIN LRG LVL3 (GOWN DISPOSABLE) ×6 IMPLANT
KIT BASIN OR (CUSTOM PROCEDURE TRAY) ×2 IMPLANT
KIT ROOM TURNOVER OR (KITS) ×2 IMPLANT
NS IRRIG 1000ML POUR BTL (IV SOLUTION) ×2 IMPLANT
PAD ARMBOARD 7.5X6 YLW CONV (MISCELLANEOUS) ×3 IMPLANT
POUCH SPECIMEN RETRIEVAL 10MM (ENDOMECHANICALS) IMPLANT
SCISSORS LAP 5X35 DISP (ENDOMECHANICALS) IMPLANT
SET CHOLANGIOGRAPH 5 50 .035 (SET/KITS/TRAYS/PACK) IMPLANT
SET IRRIG TUBING LAPAROSCOPIC (IRRIGATION / IRRIGATOR) ×2 IMPLANT
SLEEVE ENDOPATH XCEL 5M (ENDOMECHANICALS) ×4 IMPLANT
SPECIMEN JAR SMALL (MISCELLANEOUS) ×2 IMPLANT
SUT MON AB 4-0 PC3 18 (SUTURE) ×2 IMPLANT
TOWEL OR 17X24 6PK STRL BLUE (TOWEL DISPOSABLE) ×2 IMPLANT
TOWEL OR 17X26 10 PK STRL BLUE (TOWEL DISPOSABLE) ×2 IMPLANT
TRAY LAPAROSCOPIC (CUSTOM PROCEDURE TRAY) ×2 IMPLANT
TROCAR XCEL BLUNT TIP 100MML (ENDOMECHANICALS) ×2 IMPLANT
TROCAR XCEL NON-BLD 5MMX100MML (ENDOMECHANICALS) ×2 IMPLANT
WATER STERILE IRR 1000ML POUR (IV SOLUTION) IMPLANT

## 2011-12-05 NOTE — H&P (View-Only) (Signed)
Patient ID: Nicole [name], female   DOB: 09/24/1981, 31 y.o.   MRN: [id number]  Chief Complaint  Patient presents with  . Abdominal Pain    new pt gallstones    HPI Nicole [name] is a 31 y.o. female.She is a self referral. For over a year now she has been having intermittent epigastric and right upper quadrant abdominal pain nausea and projectile vomiting. It occurs at different random times but occasionally is associated with fatty meals. She denies any jaundice. She has had a workup including upper endoscopy and ultrasound. The pain can be sharp in nature. They can also be cramping. HPI  Past Medical History  Diagnosis Date  . GERD (gastroesophageal reflux disease)   . IBS (irritable bowel syndrome)     Past Surgical History  Procedure Date  . Colonoscopy     Family History  Problem Relation Age of Onset  . Hypertension Father   . Cancer Maternal Uncle     esophageal  . Hypertension Maternal Grandmother     Social History History  Substance Use Topics  . Smoking status: Current Everyday Smoker -- 1.0 packs/day    Types: Cigarettes  . Smokeless tobacco: Not on file  . Alcohol Use: Yes     daily    No Known Allergies  Current Outpatient Prescriptions  Medication Sig Dispense Refill  . clonazePAM (KLONOPIN) 1 MG tablet Take 1 mg by mouth 3 (three) times daily as needed.        . dicyclomine (BENTYL) 20 MG tablet Take 20 mg by mouth daily.        . diphenoxylate-atropine (LOMOTIL) 2.5-0.025 MG per tablet Take 1 tablet by mouth 4 (four) times daily as needed.      . GILDESS FE 1.5/30 1.5-30 MG-MCG tablet daily.      . LamoTRIgine (LAMICTAL ODT) 50 MG TBDP Take by mouth as directed.      . lamoTRIgine (LAMICTAL) 100 MG tablet Take 100 mg by mouth as directed.      . lamoTRIgine (LAMICTAL) 25 MG tablet Take 25 mg by mouth as directed.      . nitrofurantoin, macrocrystal-monohydrate, (MACROBID) 100 MG capsule Ad lib.      . omeprazole (PRILOSEC) 20 MG capsule Take 20 mg  by mouth 2 (two) times daily.        . ondansetron (ZOFRAN) 8 MG tablet Ad lib.      . pantoprazole (PROTONIX) 40 MG tablet daily.      . risperiDONE (RISPERDAL) 1 MG tablet daily.        Review of Systems Review of Systems  Constitutional: Negative for fever, chills and unexpected weight change.  HENT: Negative for hearing loss, congestion, sore throat, trouble swallowing and voice change.   Eyes: Negative for visual disturbance.  Respiratory: Negative for cough and wheezing.   Cardiovascular: Negative for chest pain, palpitations and leg swelling.  Gastrointestinal: Positive for nausea, vomiting, abdominal pain, diarrhea and abdominal distention. Negative for constipation, blood in stool and anal bleeding.  Genitourinary: Negative for hematuria, vaginal bleeding and difficulty urinating.  Musculoskeletal: Negative for arthralgias.  Skin: Negative for rash and wound.  Neurological: Negative for seizures, syncope and headaches.  Hematological: Negative for adenopathy. Does not bruise/bleed easily.  Psychiatric/Behavioral: Negative for confusion.    Blood pressure 126/90, pulse 110, temperature 98.4 F (36.9 C), temperature source Temporal, resp. rate 18, height 5' 4" (1.626 m), weight 145 lb (65.772 kg).  Physical Exam Physical Exam  Constitutional: She is oriented to   person, place, and time. She appears well-developed and well-nourished. No distress.  HENT:  Head: Normocephalic and atraumatic.  Right Ear: External ear normal.  Left Ear: External ear normal.  Nose: Nose normal.  Mouth/Throat: Oropharynx is clear and moist. No oropharyngeal exudate.  Eyes: Conjunctivae are normal. Pupils are equal, round, and reactive to light. Right eye exhibits no discharge. Left eye exhibits no discharge. No scleral icterus.  Neck: Normal range of motion. Neck supple. No tracheal deviation present. No thyromegaly present.  Cardiovascular: Normal rate, regular rhythm, normal heart sounds and  intact distal pulses.   No murmur heard. Pulmonary/Chest: Effort normal and breath sounds normal. No respiratory distress. She has no wheezes. She has no rales.  Abdominal: Soft. Bowel sounds are normal. There is tenderness. There is guarding.       Tenderness and guarding or in the right upper quadrant  Musculoskeletal: Normal range of motion. She exhibits no edema and no tenderness.  Lymphadenopathy:    She has no cervical adenopathy.  Neurological: She is alert and oriented to person, place, and time.  Skin: Skin is warm and dry. No rash noted. She is not diaphoretic. No erythema.  Psychiatric: Her behavior is normal. Judgment normal.    Data Reviewed Have an ultrasound showing her to have a gallbladder polyp. She has had an ultrasound in the past was suggest a stone  Assessment    Symptomatic cholelithiasis and possible chronic cholecystitis    Plan    At this point she wishes to proceed with cholecystectomy which I feel is very reasonable. She does have a lot of stressors which may be contributing to the problem. I agree however that laparoscopic cholecystectomy is needed. I discussed the procedure with her in detail. I gave her literature regarding it. We discussed the risk of surgery which includes but is not limited to bleeding, infection, injury to shrink structures, bile leak, bile duct injury, need to convert to an open procedure, and the chance this may not resolve any of her symptoms. She understands and wishes to proceed. Likelihood of success is good       Nicole [name] A 11/11/2011, 11:51 AM    

## 2011-12-05 NOTE — Op Note (Signed)
Laparoscopic Cholecystectomy Procedure Note  Indications: This patient presents with symptomatic gallbladder disease and will undergo laparoscopic cholecystectomy.  Pre-operative Diagnosis: Calculus of gallbladder without mention of cholecystitis or obstruction  Post-operative Diagnosis: Same  Surgeon: Abigail Miyamoto A   Assistants: 0  Anesthesia: General endotracheal anesthesia  ASA Class: 1  Procedure Details  The patient was seen again in the Holding Room. The risks, benefits, complications, treatment options, and expected outcomes were discussed with the patient. The possibilities of reaction to medication, pulmonary aspiration, perforation of viscus, bleeding, recurrent infection, finding a normal gallbladder, the need for additional procedures, failure to diagnose a condition, the possible need to convert to an open procedure, and creating a complication requiring transfusion or operation were discussed with the patient. The likelihood of improving the patient's symptoms with return to their baseline status is good.  The patient and/or family concurred with the proposed plan, giving informed consent. The site of surgery properly noted. The patient was taken to Operating Room, identified as Nicole [name] and the procedure verified as Laparoscopic Cholecystectomy with Intraoperative Cholangiogram. A Time Out was held and the above information confirmed.  Prior to the induction of general anesthesia, antibiotic prophylaxis was administered. General endotracheal anesthesia was then administered and tolerated well. After the induction, the abdomen was prepped with Chloraprep and draped in sterile fashion. The patient was positioned in the supine position.  Local anesthetic agent was injected into the skin near the umbilicus and an incision made. We dissected down to the abdominal fascia with blunt dissection.  The fascia was incised vertically and we entered the peritoneal cavity bluntly.   A pursestring suture of 0-Vicryl was placed around the fascial opening.  The Hasson cannula was inserted and secured with the stay suture.  Pneumoperitoneum was then created with CO2 and tolerated well without any adverse changes in the patient's vital signs. An 11-mm port was placed in the subxiphoid position.  Two 5-mm ports were placed in the right upper quadrant. All skin incisions were infiltrated with a local anesthetic agent before making the incision and placing the trocars.   We positioned the patient in reverse Trendelenburg, tilted slightly to the patient's left.  The gallbladder was identified, the fundus grasped and retracted cephalad. Adhesions were lysed bluntly and with the electrocautery where indicated, taking care not to injure any adjacent organs or viscus. The infundibulum was grasped and retracted laterally, exposing the peritoneum overlying the triangle of Calot. This was then divided and exposed in a blunt fashion. The cystic duct was clearly identified and bluntly dissected circumferentially. A critical view of the cystic duct and cystic artery was obtained.  The cystic duct was then ligated with clips and divided. The cystic artery was, dissected free, ligated with clips and divided as well.   The gallbladder was dissected from the liver bed in retrograde fashion with the electrocautery. The gallbladder was removed and placed in an Endocatch sac. The liver bed was irrigated and inspected. Hemostasis was achieved with the electrocautery. Copious irrigation was utilized and was repeatedly aspirated until clear.  The gallbladder and Endocatch sac were then removed through the umbilical port site.  The pursestring suture was used to close the umbilical fascia.    We again inspected the right upper quadrant for hemostasis.  Pneumoperitoneum was released as we removed the trocars.  4-0 Monocryl was used to close the skin.   Benzoin, steri-strips, and clean dressings were applied. The  patient was then extubated and brought to the recovery room  in stable condition. Instrument, sponge, and needle counts were correct at closure and at the conclusion of the case.   Findings:   Estimated Blood Loss: Minimal         Drains: 0         Specimens: Gallbladder           Complications: None; patient tolerated the procedure well.         Disposition: PACU - hemodynamically stable.         Condition: stable

## 2011-12-05 NOTE — Preoperative (Signed)
Beta Blockers   Reason not to administer Beta Blockers:Not Applicable 

## 2011-12-05 NOTE — Anesthesia Postprocedure Evaluation (Signed)
Anesthesia Post Note  Patient: Nicole [name]  Procedure(s) Performed: Procedure(s) (LRB): LAPAROSCOPIC CHOLECYSTECTOMY (N/A)  Anesthesia type: general  Patient location: PACU  Post pain: Pain level controlled  Post assessment: Patient's Cardiovascular Status Stable  Last Vitals:  Filed Vitals:   12/05/11 1045  BP: 119/78  Pulse: 70  Temp: 36.3 C  Resp: 15    Post vital signs: Reviewed and stable  Level of consciousness: sedated  Complications: No apparent anesthesia complications

## 2011-12-05 NOTE — Transfer of Care (Signed)
Immediate Anesthesia Transfer of Care Note  Patient: Nicole [name]  Procedure(s) Performed: Procedure(s) (LRB): LAPAROSCOPIC CHOLECYSTECTOMY (N/A)  Patient Location: PACU  Anesthesia Type: General  Level of Consciousness: awake, alert  and oriented  Airway & Oxygen Therapy: Patient Spontanous Breathing and Patient connected to nasal cannula oxygen  Post-op Assessment: Report given to PACU RN and Post -op Vital signs reviewed and stable  Post vital signs: Reviewed and stable  Complications: No apparent anesthesia complications

## 2011-12-05 NOTE — Progress Notes (Signed)
Reviewed d/c instructions with pt and family who both verbalized understanding.

## 2011-12-05 NOTE — Interval H&P Note (Signed)
History and Physical Interval Note:  She has had no change in her history or exam  12/05/2011 6:23 AM  [facility]  has presented today for surgery, with the diagnosis of gallstones  The various methods of treatment have been discussed with the patient and family. After consideration of risks, benefits and other options for treatment, the patient has consented to  Procedure(s) (LRB): LAPAROSCOPIC CHOLECYSTECTOMY (N/A) as a surgical intervention .  The patients' history has been reviewed, patient examined, no change in status, stable for surgery.  I have reviewed the patients' chart and labs.  Questions were answered to the patient's satisfaction.     Alam Guterrez A

## 2011-12-05 NOTE — Progress Notes (Signed)
Report given to maria rn as caregiver 

## 2011-12-05 NOTE — Anesthesia Procedure Notes (Signed)
Procedure Name: Intubation Date/Time: 12/05/2011 7:36 AM Performed by: Caryn Bee Pre-anesthesia Checklist: Patient identified, Emergency Drugs available, Suction available, Patient being monitored and Timeout performed Patient Re-evaluated:Patient Re-evaluated prior to inductionOxygen Delivery Method: Circle system utilized Preoxygenation: Pre-oxygenation with 100% oxygen Intubation Type: IV induction Ventilation: Mask ventilation without difficulty Laryngoscope Size: Mac and 3 Grade View: Grade I Tube type: Oral Tube size: 7.0 mm Number of attempts: 1 Airway Equipment and Method: Stylet Placement Confirmation: ETT inserted through vocal cords under direct vision,  positive ETCO2 and breath sounds checked- equal and bilateral Secured at: 22 cm Tube secured with: Tape Dental Injury: Teeth and Oropharynx as per pre-operative assessment

## 2011-12-05 NOTE — Anesthesia Preprocedure Evaluation (Addendum)
Anesthesia Evaluation  Patient identified by MRN, date of birth, ID band Patient awake    Reviewed: Allergy & Precautions, H&P , NPO status , Patient's Chart, lab work & pertinent test results  Airway Mallampati: I      Dental  (+) Teeth Intact and Caps,    Pulmonary Current Smoker,  clear to auscultation        Cardiovascular     Neuro/Psych Anxiety Depression    GI/Hepatic GERD-  ,  Endo/Other    Renal/GU      Musculoskeletal   Abdominal   Peds  Hematology   Anesthesia Other Findings   Reproductive/Obstetrics                         Anesthesia Physical Anesthesia Plan  ASA: II  Anesthesia Plan: General   Post-op Pain Management:    Induction: Intravenous  Airway Management Planned: Oral ETT  Additional Equipment:   Intra-op Plan:   Post-operative Plan: Extubation in OR  Informed Consent: I have reviewed the patients History and Physical, chart, labs and discussed the procedure including the risks, benefits and alternatives for the proposed anesthesia with the patient or authorized representative who has indicated his/her understanding and acceptance.   Dental advisory given  Plan Discussed with: CRNA and Surgeon  Anesthesia Plan Comments:        Anesthesia Quick Evaluation

## 2011-12-08 ENCOUNTER — Telehealth (INDEPENDENT_AMBULATORY_CARE_PROVIDER_SITE_OTHER): Payer: Self-pay | Admitting: Surgery

## 2011-12-09 ENCOUNTER — Encounter (HOSPITAL_COMMUNITY): Payer: Self-pay | Admitting: Surgery

## 2011-12-23 ENCOUNTER — Encounter (INDEPENDENT_AMBULATORY_CARE_PROVIDER_SITE_OTHER): Payer: Self-pay | Admitting: Surgery

## 2011-12-23 ENCOUNTER — Ambulatory Visit (INDEPENDENT_AMBULATORY_CARE_PROVIDER_SITE_OTHER): Payer: 59 | Admitting: Surgery

## 2011-12-23 ENCOUNTER — Encounter (INDEPENDENT_AMBULATORY_CARE_PROVIDER_SITE_OTHER): Payer: Self-pay | Admitting: General Surgery

## 2011-12-23 VITALS — BP 123/68 | HR 80 | Temp 99.0°F | Resp 18 | Ht 64.0 in | Wt 149.0 lb

## 2011-12-23 DIAGNOSIS — Z09 Encounter for follow-up examination after completed treatment for conditions other than malignant neoplasm: Secondary | ICD-10-CM

## 2011-12-23 NOTE — Progress Notes (Signed)
Subjective:     Patient ID: Nicole [name], female   DOB: [birth year], 31 y.o.   MRN: [id number]  HPI She is here for her first postoperative visit status post laparoscopic cholecystectomy. She is doing moderately well and has no complaints.  Review of Systems     Objective:   Physical Exam Her incisions are healing well. Her final pathology showed chronic cholecystitis with gallstones    Assessment:     Patient status post laparoscopic cholecystectomy    Plan:     She will be returning to work on March 14. I will see her as needed

## 2012-01-08 ENCOUNTER — Ambulatory Visit: Payer: 59 | Admitting: Family Medicine

## 2012-01-08 ENCOUNTER — Ambulatory Visit (INDEPENDENT_AMBULATORY_CARE_PROVIDER_SITE_OTHER): Payer: 59 | Admitting: Family Medicine

## 2012-01-08 ENCOUNTER — Encounter: Payer: Self-pay | Admitting: Family Medicine

## 2012-01-08 VITALS — BP 142/86 | HR 96 | Temp 97.6°F | Wt 150.0 lb

## 2012-01-08 DIAGNOSIS — F329 Major depressive disorder, single episode, unspecified: Secondary | ICD-10-CM

## 2012-01-08 DIAGNOSIS — F411 Generalized anxiety disorder: Secondary | ICD-10-CM

## 2012-01-08 DIAGNOSIS — Z136 Encounter for screening for cardiovascular disorders: Secondary | ICD-10-CM

## 2012-01-08 DIAGNOSIS — K219 Gastro-esophageal reflux disease without esophagitis: Secondary | ICD-10-CM | POA: Insufficient documentation

## 2012-01-08 DIAGNOSIS — E538 Deficiency of other specified B group vitamins: Secondary | ICD-10-CM

## 2012-01-08 DIAGNOSIS — K8020 Calculus of gallbladder without cholecystitis without obstruction: Secondary | ICD-10-CM

## 2012-01-08 DIAGNOSIS — F419 Anxiety disorder, unspecified: Secondary | ICD-10-CM | POA: Insufficient documentation

## 2012-01-08 DIAGNOSIS — F32A Depression, unspecified: Secondary | ICD-10-CM | POA: Insufficient documentation

## 2012-01-08 LAB — LIPID PANEL
Cholesterol: 154 mg/dL (ref 0–200)
LDL Cholesterol: 56 mg/dL (ref 0–99)
VLDL: 17.6 mg/dL (ref 0.0–40.0)

## 2012-01-08 LAB — COMPREHENSIVE METABOLIC PANEL
ALT: 10 U/L (ref 0–35)
Albumin: 3.6 g/dL (ref 3.5–5.2)
Alkaline Phosphatase: 64 U/L (ref 39–117)
CO2: 26 mEq/L (ref 19–32)
GFR: 95.36 mL/min (ref >60.00)
Glucose, Bld: 73 mg/dL (ref 70–99)
Potassium: 3.3 mEq/L — ABNORMAL LOW (ref 3.5–5.1)
Sodium: 136 mEq/L (ref 135–145)
Total Protein: 6.6 g/dL (ref 6.0–8.3)

## 2012-01-08 LAB — VITAMIN B12: Vitamin B-12: 221 pg/mL (ref 211–911)

## 2012-01-08 MED ORDER — VALACYCLOVIR HCL 500 MG PO TABS: 500.0000 mg | ORAL_TABLET | Freq: Every day | ORAL | Status: AC

## 2012-01-08 NOTE — Progress Notes (Signed)
Subjective:    Patient ID: Nicole [name], female    DOB: [birth year], 31 y.o.   MRN: [id number]  HPI  31 yo here to establish care.  H/o B12 deficiency- has not had B12 checked in years. Has been more fatigued. Would like it rechecked.  Recent h/o cholecystectomy- history of vomiting for over 1 year.  Since she had gallbladder removed, feels "100% better!"  No recurrent nausea or vomiting.  Severe depression- Followed by Dr. Jennelle Human, psychiatry. On Risperdal 1 mg daily and Lamictal 50 mg daily. Cannot tolerate antidepressants. Has increased stressors at home- son is 32 yo- multiple issues- bipolar, behavoiral issues. Feels on these meds, she is handling it better. No SI or HI.  Patient Active Problem List  Diagnoses  . Symptomatic cholelithiasis  . Depression  . GERD (gastroesophageal reflux disease)  . Anxiety  . B12 deficiency   Past Medical History  Diagnosis Date  . IBS (irritable bowel syndrome)   . Urinary tract infection     hx of UTIs  . Herpes   . Depression   . GERD (gastroesophageal reflux disease)   . Anxiety    Past Surgical History  Procedure Date  . Colonoscopy   . Cholecystectomy 12/05/2011    Procedure: LAPAROSCOPIC CHOLECYSTECTOMY;  Surgeon: Shelly Rubenstein, MD;  Location: [facility] OR;  Service: General;  Laterality: N/A;   History  Substance Use Topics  . Smoking status: Current Everyday Smoker -- 1.0 packs/day for 10 years    Types: Cigarettes  . Smokeless tobacco: Never Used  . Alcohol Use: Yes     daily- "really drunk yesterday"- 12/04/2011   Family History  Problem Relation Age of Onset  . Hypertension Father   . Stroke Father   . Cancer Maternal Uncle     esophageal  . Hypertension Maternal Grandmother   . Anesthesia problems Neg Hx   . Hypotension Neg Hx   . Malignant hyperthermia Neg Hx   . Pseudochol deficiency Neg Hx    Allergies  Allergen Reactions  . Protonix Other (See Comments)    Made acid reflux worse   Current  Outpatient Prescriptions on File Prior to Visit  Medication Sig Dispense Refill  . clonazePAM (KLONOPIN) 1 MG tablet Take 1 mg by mouth 3 (three) times daily as needed. For anxiety      . GILDESS FE 1.5/30 1.5-30 MG-MCG tablet Take 1 tablet by mouth daily.       . LamoTRIgine (LAMICTAL ODT) 50 MG TBDP Take 1 tablet by mouth daily.       . nitrofurantoin, macrocrystal-monohydrate, (MACROBID) 100 MG capsule Take 100 mg by mouth daily as needed. For infection      . omeprazole (PRILOSEC) 20 MG capsule Take 20 mg by mouth 2 (two) times daily.        . risperiDONE (RISPERDAL) 1 MG tablet Take 1 mg by mouth daily.       . prazosin (MINIPRESS) 1 MG capsule        The PMH, PSH, Social History, Family History, Medications, and allergies have been reviewed in [facility], and have been updated if relevant.    Review of Systems See HPI Patient reports no  vision/ hearing changes,anorexia, weight change, fever ,adenopathy, persistant / recurrent hoarseness, swallowing issues, chest pain, edema,persistant / recurrent cough, hemoptysis, dyspnea(rest, exertional, paroxysmal nocturnal), gastrointestinal  bleeding (melena, rectal bleeding), abdominal pain, excessive heart burn, GU symptoms(dysuria, hematuria, pyuria, voiding/incontinence  Issues) syncope, focal weakness, severe memory loss, concerning skin lesions, depression,  anxiety, abnormal bruising/bleeding, major joint swelling, breast masses or abnormal vaginal bleeding.       Objective:   Physical Exam BP 142/86  Pulse 96  Temp(Src) 97.6 F (36.4 C) (Oral)  Wt 150 lb (68.04 kg)  General:  Well-developed,well-nourished,in no acute distress; alert,appropriate and cooperative throughout examination Head:  normocephalic and atraumatic.   Eyes:  vision grossly intact, pupils equal, pupils round, and pupils reactive to light.   Ears:  R ear normal and L ear normal.   Nose:  no external deformity.   Mouth:  good dentition.   Lungs:  Normal respiratory  effort, chest expands symmetrically. Lungs are clear to auscultation, no crackles or wheezes. Heart:  Normal rate and regular rhythm. S1 and S2 normal without gallop, murmur, click, rub or other extra sounds. Abdomen:  Bowel sounds positive,abdomen soft and non-tender without masses, organomegaly or hernias noted. Msk:  No deformity or scoliosis noted of thoracic or lumbar spine.   Extremities:  No clubbing, cyanosis, edema, or deformity noted with normal full range of motion of all joints.   Neurologic:  alert & oriented X3 and gait normal.   Skin:  Intact without suspicious lesions or rashes Psych:  Cognition and judgment appear intact. Alert and cooperative with normal attention span and concentration. No apparent delusions, illusions, hallucinations      Assessment & Plan:   1. B12 deficiency  Recheck B12 today. Vitamin B12  2. Screening for ischemic heart disease  Comprehensive metabolic panel, Lipid Panel  3. Anxiety  Stable, followed by psychiatry.   4. Depression Stable, check labs due to medications she is taking.    5. Symptomatic cholelithiasis  Much improved!

## 2012-01-08 NOTE — Patient Instructions (Signed)
It was great to meet you. We will call you with your lab results next week. Have a great holiday.

## 2012-03-15 ENCOUNTER — Encounter: Payer: Self-pay | Admitting: Family Medicine

## 2012-03-15 ENCOUNTER — Telehealth: Payer: Self-pay

## 2012-03-15 ENCOUNTER — Ambulatory Visit (INDEPENDENT_AMBULATORY_CARE_PROVIDER_SITE_OTHER): Payer: 59 | Admitting: Family Medicine

## 2012-03-15 VITALS — BP 100/60 | HR 102 | Temp 98.8°F | Ht 64.0 in | Wt 158.0 lb

## 2012-03-15 DIAGNOSIS — J189 Pneumonia, unspecified organism: Secondary | ICD-10-CM

## 2012-03-15 MED ORDER — DEXAMETHASONE SODIUM PHOSPHATE 10 MG/ML IJ SOLN: 10.0000 mg | Freq: Once | INTRAMUSCULAR | Status: DC

## 2012-03-15 NOTE — Telephone Encounter (Signed)
Pt request appt f/u pneumonia; seen [facility] last week. appt given today with Dr Dayton Martes.

## 2012-03-15 NOTE — Patient Instructions (Signed)
Please finish course of Zpack as directed. Please use albuterol inhaler and tussionex as directed by ER physician. Call us tomorrow with an update of how you are feeling.

## 2012-03-15 NOTE — Progress Notes (Signed)
Subjective:    Patient ID: Nicole [name], female    DOB: 12-24-1980, 31 y.o.   MRN: [id number]  HPI  31 yo here for ER follow up( awaiting records).  Over 2 weeks of cough, congestion and left ear pain.  Friday night, develop terrible coughing spell, SOB.  No fevers. Went to [facility] ER.  Per pt, CXR positive for PNA. Given Zpack, albuterol and tussionex as needed.  Coughed so hard this morning, had left rib pain- now hurts to take deep breaths.  Patient Active Problem List  Diagnoses  . Symptomatic cholelithiasis  . Depression  . GERD (gastroesophageal reflux disease)  . Anxiety  . B12 deficiency  . Pneumonia   Past Medical History  Diagnosis Date  . IBS (irritable bowel syndrome)   . Urinary tract infection     hx of UTIs  . Herpes   . Depression   . GERD (gastroesophageal reflux disease)   . Anxiety    Past Surgical History  Procedure Date  . Colonoscopy   . Cholecystectomy 12/05/2011    Procedure: LAPAROSCOPIC CHOLECYSTECTOMY;  Surgeon: Shelly Rubenstein, MD;  Location: [facility] OR;  Service: General;  Laterality: N/A;   History  Substance Use Topics  . Smoking status: Current Everyday Smoker -- 1.0 packs/day for 10 years    Types: Cigarettes  . Smokeless tobacco: Never Used  . Alcohol Use: Yes     daily- "really drunk yesterday"- 12/04/2011   Family History  Problem Relation Age of Onset  . Hypertension Father   . Stroke Father   . Cancer Maternal Uncle     esophageal  . Hypertension Maternal Grandmother   . Anesthesia problems Neg Hx   . Hypotension Neg Hx   . Malignant hyperthermia Neg Hx   . Pseudochol deficiency Neg Hx    Allergies  Allergen Reactions  . Pantoprazole Sodium Other (See Comments)    Made acid reflux worse   Current Outpatient Prescriptions on File Prior to Visit  Medication Sig Dispense Refill  . albuterol (PROVENTIL HFA;VENTOLIN HFA) 108 (90 BASE) MCG/ACT inhaler Inhale 2 puffs into the lungs every 6 (six) hours as needed.       . clonazePAM (KLONOPIN) 1 MG tablet Take 1 mg by mouth 3 (three) times daily as needed. For anxiety      . GILDESS FE 1.5/30 1.5-30 MG-MCG tablet Take 1 tablet by mouth daily.       . LamoTRIgine (LAMICTAL ODT) 50 MG TBDP Take 1 tablet by mouth daily.       . nitrofurantoin, macrocrystal-monohydrate, (MACROBID) 100 MG capsule Take 100 mg by mouth daily as needed. For infection      . omeprazole (PRILOSEC) 20 MG capsule Take 20 mg by mouth 2 (two) times daily.        . prazosin (MINIPRESS) 1 MG capsule       . risperiDONE (RISPERDAL) 1 MG tablet Take 1 mg by mouth daily.        The PMH, PSH, Social History, Family History, Medications, and allergies have been reviewed in [facility], and have been updated if relevant.   Review of Systems See HPI No nausea or vomiting No CP    Objective:   Physical Exam BP 100/60  Pulse 102  Temp(Src) 98.8 F (37.1 C) (Oral)  Ht 5\' 4"  (1.626 m)  Wt 158 lb (71.668 kg)  BMI 27.12 kg/m2  SpO2 98%  General:  Well-developed,well-nourished,in no acute distress; alert,appropriate and cooperative throughout examination Head:  normocephalic  and atraumatic.   Eyes:  vision grossly intact, pupils equal, pupils round, and pupils reactive to light.   Ears:  Left tm- thick fluid behind TM Nose:  no external deformity.   Mouth:  good dentition.   Pos pnd Neck:  No deformities, masses, or tenderness noted. Lungs:  Normal respiratory effort, chest expands symmetrically. Lungs are clear to auscultation, no crackles or wheezes. TTP over left inferior rib cage Heart:  Normal rate and regular rhythm. S1 and S2 normal without gallop, murmur, click, rub or other extra sounds. Msk:  No deformity or scoliosis noted of thoracic or lumbar spine.   Extremities:  No clubbing, cyanosis, edema, or deformity noted with normal full range of motion of all joints.   Neurologic:  alert & oriented X3 and gait normal.   Skin:  Intact without suspicious lesions or rashes Psych:   Cognition and judgment appear intact. Alert and cooperative with normal attention span and concentration. No apparent delusions, illusions, hallucinations     Assessment & Plan:   1. Community acquired pneumonia  dexamethasone (DECADRON) injection 10 mg   New- awaiting records. Lungs clear on exam today- pulse ox good. Given inflammation in ear, persistent wheezing (just used inhaler prior to OV), given IM decadron in office. Finish abx as directed. The patient indicates understanding of these issues and agrees with the plan.

## 2012-06-16 ENCOUNTER — Telehealth: Payer: Self-pay | Admitting: Family Medicine

## 2012-06-16 NOTE — Telephone Encounter (Signed)
Appointment scheduled for 08/20/12.

## 2012-06-16 NOTE — Telephone Encounter (Signed)
Patient said she can't have a physical done in the morning because she works.  She said no one else  can do her job.  She can come anytime after 2:00 for her physical or she's on vacation from 07/26/12-07/30/12.  Can patient be seen in the afternoon or the week of her vacation for a physical?

## 2012-06-16 NOTE — Telephone Encounter (Signed)
Left message asking that pt call back. 

## 2012-06-16 NOTE — Telephone Encounter (Signed)
Yes

## 2012-07-30 ENCOUNTER — Ambulatory Visit (INDEPENDENT_AMBULATORY_CARE_PROVIDER_SITE_OTHER): Payer: 59 | Admitting: Family Medicine

## 2012-07-30 ENCOUNTER — Encounter: Payer: Self-pay | Admitting: Family Medicine

## 2012-07-30 VITALS — BP 140/94 | HR 88 | Temp 98.0°F | Ht 64.5 in | Wt 159.0 lb

## 2012-07-30 DIAGNOSIS — F329 Major depressive disorder, single episode, unspecified: Secondary | ICD-10-CM

## 2012-07-30 DIAGNOSIS — E538 Deficiency of other specified B group vitamins: Secondary | ICD-10-CM

## 2012-07-30 DIAGNOSIS — Z136 Encounter for screening for cardiovascular disorders: Secondary | ICD-10-CM

## 2012-07-30 DIAGNOSIS — F32A Depression, unspecified: Secondary | ICD-10-CM

## 2012-07-30 DIAGNOSIS — Z0000 Encounter for general adult medical examination without abnormal findings: Secondary | ICD-10-CM | POA: Insufficient documentation

## 2012-07-30 NOTE — Patient Instructions (Addendum)
It was good to see you. If your pain continues and you can't get through to OBGYN, call me and I will order pelvic ultrasound.  Try Lamisil over the counter.  If no relief, call me.  We will call you with your lab results.

## 2012-07-30 NOTE — Progress Notes (Signed)
Subjective:    Patient ID: Nicole [name], female    DOB: 22-Apr-1981, 31 y.o.   MRN: [id number]  HPI  31 yo here for CPX.  On OCPs continuously, does not have periods.  Has GYN- scheduled for yearly appointment next month. Having some intermittent abdominal pain, does not feel like her IBS.  Has called her GYN office. No diarrhea, nausea or vomiting.  H/o B12 deficiency-  Lab Results  Component Value Date   VITAMINB12 221 01/08/2012    Lab Results  Component Value Date   CHOL 154 01/08/2012   HDL 80.30 01/08/2012   LDLCALC 56 01/08/2012   TRIG 88.0 01/08/2012   CHOLHDL 2 01/08/2012    Severe depression- Followed by Dr. Jennelle Human, psychiatry. Just added Clonidine to help with anxiety. On Risperdal 1 mg daily and Lamictal 50 mg daily. Cannot tolerate antidepressants. Has increased stressors at home- son is 7 yo- multiple issues- bipolar, behavoiral issues. Feels on these meds, she is handling it better. No SI or HI.   Quit smoking in August 2013!!  Used patches.  Patient Active Problem List  Diagnosis  . Symptomatic cholelithiasis  . Depression  . GERD (gastroesophageal reflux disease)  . Anxiety  . B12 deficiency  . Community acquired pneumonia  . Routine general medical examination at a health care facility   Past Medical History  Diagnosis Date  . IBS (irritable bowel syndrome)   . Urinary tract infection     hx of UTIs  . Herpes   . Depression   . GERD (gastroesophageal reflux disease)   . Anxiety    Past Surgical History  Procedure Date  . Colonoscopy   . Cholecystectomy 12/05/2011    Procedure: LAPAROSCOPIC CHOLECYSTECTOMY;  Surgeon: Shelly Rubenstein, MD;  Location: [facility] OR;  Service: General;  Laterality: N/A;   History  Substance Use Topics  . Smoking status: Current Every Day Smoker -- 1.0 packs/day for 10 years    Types: Cigarettes  . Smokeless tobacco: Never Used  . Alcohol Use: Yes     daily- "really drunk yesterday"- 12/04/2011   Family  History  Problem Relation Age of Onset  . Hypertension Father   . Stroke Father   . Cancer Maternal Uncle     esophageal  . Hypertension Maternal Grandmother   . Anesthesia problems Neg Hx   . Hypotension Neg Hx   . Malignant hyperthermia Neg Hx   . Pseudochol deficiency Neg Hx    Allergies  Allergen Reactions  . Pantoprazole Sodium Other (See Comments)    Made acid reflux worse   Current Outpatient Prescriptions on File Prior to Visit  Medication Sig Dispense Refill  . albuterol (PROVENTIL HFA;VENTOLIN HFA) 108 (90 BASE) MCG/ACT inhaler Inhale 2 puffs into the lungs every 6 (six) hours as needed.      Marland Kitchen azithromycin (ZITHROMAX) 250 MG tablet Take 250 mg by mouth daily.      . chlorpheniramine-HYDROcodone (TUSSIONEX) 10-8 MG/5ML LQCR Take 5 mLs by mouth every 12 (twelve) hours as needed.      . clonazePAM (KLONOPIN) 1 MG tablet Take 1 mg by mouth 3 (three) times daily as needed. For anxiety      . GILDESS FE 1.5/30 1.5-30 MG-MCG tablet Take 1 tablet by mouth daily.       . LamoTRIgine (LAMICTAL ODT) 50 MG TBDP Take 1 tablet by mouth daily.       . nitrofurantoin, macrocrystal-monohydrate, (MACROBID) 100 MG capsule Take 100 mg by mouth daily as  needed. For infection      . omeprazole (PRILOSEC) 20 MG capsule Take 20 mg by mouth 2 (two) times daily.        . prazosin (MINIPRESS) 1 MG capsule       . risperiDONE (RISPERDAL) 1 MG tablet Take 1 mg by mouth daily.        Current Facility-Administered Medications on File Prior to Visit  Medication Dose Route Frequency Provider Last Rate Last Dose  . dexamethasone (DECADRON) injection 10 mg  10 mg Intramuscular Once Dianne Dun, MD       The PMH, PSH, Social History, Family History, Medications, and allergies have been reviewed in [facility], and have been updated if relevant.    Review of Systems See HPI Patient reports no  vision/ hearing changes,anorexia, weight change, fever ,adenopathy, persistant / recurrent hoarseness, swallowing  issues, chest pain, edema,persistant / recurrent cough, hemoptysis, dyspnea(rest, exertional, paroxysmal nocturnal), gastrointestinal  bleeding (melena, rectal bleeding), abdominal pain, excessive heart burn, GU symptoms(dysuria, hematuria, pyuria, voiding/incontinence  Issues) syncope, focal weakness, severe memory loss, concerning skin lesions, depression, anxiety, abnormal bruising/bleeding, major joint swelling, breast masses or abnormal vaginal bleeding.       Objective:   Physical Exam BP 140/94  Pulse 88  Temp 98 F (36.7 C)  Ht 5' 4.5" (1.638 m)  Wt 159 lb (72.122 kg)  BMI 26.87 kg/m2  General:  Well-developed,well-nourished,in no acute distress; alert,appropriate and cooperative throughout examination Head:  normocephalic and atraumatic.   Eyes:  vision grossly intact, pupils equal, pupils round, and pupils reactive to light.   Ears:  R ear normal and L ear normal.   Nose:  no external deformity.   Mouth:  good dentition.   Neck:  No deformities, masses, or tenderness noted. Lungs:  Normal respiratory effort, chest expands symmetrically. Lungs are clear to auscultation, no crackles or wheezes. Heart:  Normal rate and regular rhythm. S1 and S2 normal without gallop, murmur, click, rub or other extra sounds. Abdomen:  Bowel sounds positive,abdomen soft and non-tender without masses, organomegaly or hernias noted. Rectal:  no external abnormalities.   Msk:  No deformity or scoliosis noted of thoracic or lumbar spine.   Extremities:  No clubbing, cyanosis, edema, or deformity noted with normal full range of motion of all joints.   Neurologic:  alert & oriented X3 and gait normal.   Skin:  Intact without suspicious lesions or rashes Cervical Nodes:  No lymphadenopathy noted Psych:  Cognition and judgment appear intact. Alert and cooperative with normal attention span and concentration. No apparent delusions, illusions, hallucinations       Assessment & Plan:   1. Routine  general medical examination at a health care facility   Reviewed preventive care protocols, scheduled due services, and updated immunizations Discussed nutrition, exercise, diet, and healthy lifestyle.  Orders Placed This Encounter  Procedures  . Comprehensive metabolic panel  . Vitamin B12  . Lipid Panel    2. B12 deficiency  Recheck B12 today. Vitamin B12  3. Anxiety  Stable, followed by psychiatry.   4. Depression Stable.

## 2012-07-31 LAB — COMPREHENSIVE METABOLIC PANEL
AST: 15 U/L (ref 0–37)
Albumin: 3.6 g/dL (ref 3.5–5.2)
Alkaline Phosphatase: 68 U/L (ref 39–117)
BUN: 5 mg/dL — ABNORMAL LOW (ref 6–23)
Calcium: 8.7 mg/dL (ref 8.4–10.5)
Chloride: 107 mEq/L (ref 96–112)
Potassium: 4 mEq/L (ref 3.5–5.3)
Sodium: 140 mEq/L (ref 135–145)
Total Protein: 6.1 g/dL (ref 6.0–8.3)

## 2012-07-31 LAB — LIPID PANEL
LDL Cholesterol: 83 mg/dL (ref 0–99)
Triglycerides: 122 mg/dL (ref <150)
VLDL: 24 mg/dL (ref 0–40)

## 2012-08-02 ENCOUNTER — Encounter: Payer: Self-pay | Admitting: *Deleted

## 2012-09-18 ENCOUNTER — Other Ambulatory Visit: Payer: Self-pay | Admitting: Family Medicine

## 2012-09-19 ENCOUNTER — Other Ambulatory Visit: Payer: Self-pay | Admitting: Family Medicine

## 2012-09-24 ENCOUNTER — Encounter: Payer: Self-pay | Admitting: Family Medicine

## 2012-10-27 ENCOUNTER — Telehealth: Payer: Self-pay | Admitting: Family Medicine

## 2012-10-27 ENCOUNTER — Encounter: Payer: Self-pay | Admitting: Family Medicine

## 2012-10-27 ENCOUNTER — Ambulatory Visit (INDEPENDENT_AMBULATORY_CARE_PROVIDER_SITE_OTHER): Payer: 59 | Admitting: Family Medicine

## 2012-10-27 VITALS — BP 128/92 | HR 100 | Temp 98.6°F | Ht 64.5 in | Wt 156.5 lb

## 2012-10-27 DIAGNOSIS — J111 Influenza due to unidentified influenza virus with other respiratory manifestations: Secondary | ICD-10-CM

## 2012-10-27 DIAGNOSIS — N421 Congestion and hemorrhage of prostate: Secondary | ICD-10-CM

## 2012-10-27 DIAGNOSIS — R52 Pain, unspecified: Secondary | ICD-10-CM

## 2012-10-27 DIAGNOSIS — R059 Cough, unspecified: Secondary | ICD-10-CM

## 2012-10-27 DIAGNOSIS — R05 Cough: Secondary | ICD-10-CM

## 2012-10-27 LAB — POCT INFLUENZA A/B
Influenza A, POC: NEGATIVE
Influenza B, POC: NEGATIVE

## 2012-10-27 MED ORDER — OSELTAMIVIR PHOSPHATE 75 MG PO CAPS: 75.0000 mg | ORAL_CAPSULE | Freq: Two times a day (BID) | ORAL | Status: DC

## 2012-10-27 NOTE — Progress Notes (Signed)
Subjective:    Patient ID: Nicole [name], female    DOB: Dec 22, 1980, 32 y.o.   MRN: [id number]  HPI Woke up this am with uri symptoms Fever / chills/ aches-- temp was up to 101.3 Does not get flu shots  Vomited times one - out of nowhere  Little cough -not prod  Throat hurts mildly   No diarrhea  No abd pain   Has not taken any medicines besides a decongestant   Patient Active Problem List  Diagnosis  . Symptomatic cholelithiasis  . Depression  . GERD (gastroesophageal reflux disease)  . Anxiety  . B12 deficiency  . Community acquired pneumonia  . Routine general medical examination at a health care facility   Past Medical History  Diagnosis Date  . IBS (irritable bowel syndrome)   . Urinary tract infection     hx of UTIs  . Herpes   . Depression   . GERD (gastroesophageal reflux disease)   . Anxiety    Past Surgical History  Procedure Date  . Colonoscopy   . Cholecystectomy 12/05/2011    Procedure: LAPAROSCOPIC CHOLECYSTECTOMY;  Surgeon: Shelly Rubenstein, MD;  Location: [facility] OR;  Service: General;  Laterality: N/A;   History  Substance Use Topics  . Smoking status: Former Smoker -- 1.0 packs/day for 10 years    Types: Cigarettes  . Smokeless tobacco: Never Used     Comment: Quit 06/07/12.  using patches  . Alcohol Use: Yes     Comment: daily- "really drunk yesterday"- 12/04/2011   Family History  Problem Relation Age of Onset  . Hypertension Father   . Stroke Father   . Cancer Maternal Uncle     esophageal  . Hypertension Maternal Grandmother   . Anesthesia problems Neg Hx   . Hypotension Neg Hx   . Malignant hyperthermia Neg Hx   . Pseudochol deficiency Neg Hx    Allergies  Allergen Reactions  . Pantoprazole Sodium Other (See Comments)    Made acid reflux worse   Current Outpatient Prescriptions on File Prior to Visit  Medication Sig Dispense Refill  . albuterol (PROVENTIL HFA;VENTOLIN HFA) 108 (90 BASE) MCG/ACT inhaler Inhale 2 puffs into the  lungs every 6 (six) hours as needed.      . clonazePAM (KLONOPIN) 1 MG tablet Take 1 mg by mouth 3 (three) times daily as needed. For anxiety      . GILDESS FE 1.5/30 1.5-30 MG-MCG tablet Take 1 tablet by mouth daily.       . LamoTRIgine (LAMICTAL ODT) 50 MG TBDP Take 1 tablet by mouth daily.       . nitrofurantoin, macrocrystal-monohydrate, (MACROBID) 100 MG capsule TAKE 1 CAPSULE BY MOUTH WITH FOOD AS NEEDED AFTER INTERCOURSE  90 capsule  0  . valACYclovir (VALTREX) 500 MG tablet TAKE 1 TABLET EVERY 12 HOURS FOR 3 DAYS AS NEEDED  30 tablet  2   Current Facility-Administered Medications on File Prior to Visit  Medication Dose Route Frequency Provider Last Rate Last Dose  . dexamethasone (DECADRON) injection 10 mg  10 mg Intramuscular Once Dianne Dun, MD           Review of Systems Review of Systems  Constitutional: Negative for unexpected weight change. pos for fever and malaise and fatigue ENT pos for some rhinorrhea/neg for st Eyes: Negative for pain and visual disturbance.  Respiratory: Negative for wheeze and shortness of breath.   Cardiovascular: Negative for cp or palpitations    Gastrointestinal: Negative for  nausea, diarrhea and constipation. pos for vomiting times one, neg for abd pain  Genitourinary: Negative for urgency and frequency.  Skin: Negative for pallor or rash   Neurological: Negative for weakness, light-headedness, numbness and headaches.  Hematological: Negative for adenopathy. Does not bruise/bleed easily.  Psychiatric/Behavioral: Negative for dysphoric mood. The patient is not nervous/anxious.         Objective:   Physical Exam  Constitutional: She appears well-developed and well-nourished. No distress.  HENT:  Head: Normocephalic and atraumatic.  Left Ear: External ear normal.  Mouth/Throat: Oropharynx is clear and moist. No oropharyngeal exudate.       Nares are boggy with clear rhinorrhea No sinus tenderness  Eyes: Conjunctivae normal and EOM are  normal. Pupils are equal, round, and reactive to light. Right eye exhibits no discharge. Left eye exhibits no discharge.  Neck: Normal range of motion. Neck supple.  Cardiovascular: Regular rhythm.   Pulmonary/Chest: Effort normal and breath sounds normal. No respiratory distress. She has no wheezes. She has no rales.  Abdominal: Soft. Bowel sounds are normal. She exhibits no distension. There is no tenderness.  Lymphadenopathy:    She has no cervical adenopathy.  Neurological: She is alert.  Skin: Skin is warm and dry. No rash noted.  Psychiatric: She has a normal mood and affect.          Assessment & Plan:

## 2012-10-27 NOTE — Telephone Encounter (Signed)
Patient Information:  Caller Name: Michiah  Phone: [phone]  Patient: Nicole [name], Nicole [name]  Gender: Female  DOB: [birth year]  Age: 32 Years  PCP: Ruthe Mannan [facility])  Pregnant: No  Office Follow Up:  Does the office need to follow up with this patient?: No  Instructions For The Office: N/A  RN Note:  Highest temp has been 101.2, currently temp is down to 99.8, Headache 7/10; Body aches 6-7/10 Cough is dry and hacky; Nasal drainage is clear.  Cough non productive.  No emergent symptoms assessed.  Patient requested to be seen vs home care.  Appointment scheduled.  Symptoms  Reason For Call & Symptoms: Fever, aches, chills, cough with could symptoms  Reviewed Health History In EMR: Yes  Reviewed Medications In EMR: Yes  Reviewed Allergies In EMR: Yes  Reviewed Surgeries / Procedures: Yes  Date of Onset of Symptoms: 10/27/2012  Treatments Tried: Decongestant  Treatments Tried Worked: No  Any Fever: Yes  Fever Taken: Oral  Fever Time Of Reading: 10:30:00  Fever Last Reading: 99 OB / GYN:  LMP: Unknown  Guideline(s) Used:  Influenza - Seasonal  Disposition Per Guideline:   See Today or Tomorrow in Office  Reason For Disposition Reached:   Patient wants to be seen  Advice Given:  Reassurance  For most healthy adults, influenza feels like a bad cold. The dangers of influenza for normal, healthy people (under 54 years of age) are overrated.  Treating the Symptoms of Flu  Fever, Muscle Aches, and Headache: For fever more than 101 F (38.3 C), muscle aches, and headaches, take acetaminophen every 4-6 hours (Adults 650 mg) OR ibuprofen every 6-8 hours (Adults 400-600 mg).  Sore Throat: Use throat lozenges, hard candy or warm chicken broth.  Cough: Use cough drops.  Hydrate: Drink extra liquids. If the air in your home is dry, use a humidifier.  No Aspirin  : Do not use aspirin for treatment of fever or pain (Reason: there is an association between influenza and Reye  syndrome).  Isolation is Needed Until After the Fever is Gone:   The CDC recommends that people with influenza-like illness remain at home until at least 24 hours after they are free of fever (100 F or 37.8C).  Call Back If:  Fever lasts more than 3 days  Runny nose lasts more than 10 days  Cough lasts more than 3 weeks  You become short of breath or worse.  Appointment Scheduled:  10/27/2012 12:00:00 Appointment Scheduled Provider:  Roxy Manns [facility]); Dr. Dayton Martes had no available slots

## 2012-10-27 NOTE — Assessment & Plan Note (Signed)
Though rapid flu test was neg - I strongly suspect early flu with fever/ aches and upper resp symptoms Covered with tamiflu for 5 d Disc symptomatic care - see instructions on AVS Update if not starting to improve in a week or if worsening   Work note written until [employer]

## 2012-10-27 NOTE — Patient Instructions (Addendum)
I think you have the flu  Please get rest and drink fluids  Take the tamiflu as directed  Tylenol as directed for aches/ fever/ headache  Update if not starting to improve in a week or if worsening

## 2012-10-30 ENCOUNTER — Emergency Department (HOSPITAL_COMMUNITY)
Admission: EM | Admit: 2012-10-30 | Discharge: 2012-10-30 | Disposition: A | Discharge status: Home / Self Care | Payer: 59 | Attending: Emergency Medicine | Admitting: Emergency Medicine

## 2012-10-30 DIAGNOSIS — F411 Generalized anxiety disorder: Secondary | ICD-10-CM | POA: Insufficient documentation

## 2012-10-30 DIAGNOSIS — Z87440 Personal history of urinary (tract) infections: Secondary | ICD-10-CM | POA: Insufficient documentation

## 2012-10-30 DIAGNOSIS — F3289 Other specified depressive episodes: Secondary | ICD-10-CM | POA: Insufficient documentation

## 2012-10-30 DIAGNOSIS — R1110 Vomiting, unspecified: Secondary | ICD-10-CM | POA: Insufficient documentation

## 2012-10-30 DIAGNOSIS — K219 Gastro-esophageal reflux disease without esophagitis: Secondary | ICD-10-CM | POA: Insufficient documentation

## 2012-10-30 DIAGNOSIS — Z87891 Personal history of nicotine dependence: Secondary | ICD-10-CM | POA: Insufficient documentation

## 2012-10-30 DIAGNOSIS — B9789 Other viral agents as the cause of diseases classified elsewhere: Secondary | ICD-10-CM | POA: Insufficient documentation

## 2012-10-30 DIAGNOSIS — Z79899 Other long term (current) drug therapy: Secondary | ICD-10-CM | POA: Insufficient documentation

## 2012-10-30 DIAGNOSIS — F329 Major depressive disorder, single episode, unspecified: Secondary | ICD-10-CM | POA: Insufficient documentation

## 2012-10-30 DIAGNOSIS — B349 Viral infection, unspecified: Secondary | ICD-10-CM

## 2012-10-30 DIAGNOSIS — Z8719 Personal history of other diseases of the digestive system: Secondary | ICD-10-CM | POA: Insufficient documentation

## 2012-10-30 DIAGNOSIS — R5381 Other malaise: Secondary | ICD-10-CM | POA: Insufficient documentation

## 2012-10-30 DIAGNOSIS — Z8619 Personal history of other infectious and parasitic diseases: Secondary | ICD-10-CM | POA: Insufficient documentation

## 2012-10-30 DIAGNOSIS — Z3202 Encounter for pregnancy test, result negative: Secondary | ICD-10-CM | POA: Insufficient documentation

## 2012-10-30 LAB — COMPREHENSIVE METABOLIC PANEL
ALT: 84 U/L — ABNORMAL HIGH (ref 0–35)
AST: 79 U/L — ABNORMAL HIGH (ref 0–37)
CO2: 20 mEq/L (ref 19–32)
Chloride: 102 mEq/L (ref 96–112)
GFR calc non Af Amer: >90 mL/min (ref >90)
Potassium: 3.5 mEq/L (ref 3.5–5.1)
Sodium: 135 mEq/L (ref 135–145)
Total Bilirubin: 0.3 mg/dL (ref 0.3–1.2)

## 2012-10-30 LAB — CBC WITH DIFFERENTIAL/PLATELET
Basophils Relative: 0 % (ref 0–1)
Eosinophils Absolute: 0.1 10*3/uL (ref 0.0–0.7)
Eosinophils Relative: 1 % (ref 0–5)
MCH: 33.4 pg (ref 26.0–34.0)
MCHC: 34.9 g/dL (ref 30.0–36.0)
MCV: 95.7 fL (ref 78.0–100.0)
Monocytes Relative: 4 % (ref 3–12)
Neutrophils Relative %: 66 % (ref 43–77)
Platelets: 226 10*3/uL (ref 150–400)

## 2012-10-30 LAB — URINALYSIS, ROUTINE W REFLEX MICROSCOPIC
Ketones, ur: 15 mg/dL — AB
Leukocytes, UA: NEGATIVE
Nitrite: NEGATIVE
Protein, ur: 30 mg/dL — AB
Urobilinogen, UA: 1 mg/dL (ref 0.0–1.0)

## 2012-10-30 LAB — URINE MICROSCOPIC-ADD ON

## 2012-10-30 LAB — POCT PREGNANCY, URINE: Preg Test, Ur: NEGATIVE

## 2012-10-30 NOTE — ED Notes (Signed)
Pt left before receiving discharge paperwork. 

## 2012-10-30 NOTE — ED Provider Notes (Signed)
History     CSN: [id number]  Arrival date & time 10/30/12  8413   First MD Initiated Contact with Patient 10/30/12 2038      Chief Complaint  Patient presents with  . Abdominal Pain  . Influenza    (Consider location/radiation/quality/duration/timing/severity/associated sxs/prior treatment) Patient is a 32 y.o. female presenting with abdominal pain. The history is provided by the patient and a parent. History Limited By: pt was uncooperative in answering questions.  Abdominal Pain The primary symptoms of the illness include abdominal pain, fatigue and vomiting. The primary symptoms of the illness do not include fever, shortness of breath, nausea, diarrhea or dysuria. The current episode started more than 2 days ago. The onset of the illness was gradual. The problem has not changed since onset. The abdominal pain began more than 2 days ago. The abdominal pain has been unchanged since its onset. The abdominal pain is generalized. The abdominal pain does not radiate. Pain scale: Pt could not give a number. Seemed moderate discomfort. The abdominal pain is relieved by nothing. Exacerbated by: nothing made it better or worse, just always the same.  The vomiting began more than 2 days ago.  Associated with: pt was dx w flu on Wed, given Tamiflu. Change in bowel habit: diarrhea. Symptoms associated with the illness do not include diaphoresis or constipation.    Past Medical History  Diagnosis Date  . IBS (irritable bowel syndrome)   . Urinary tract infection     hx of UTIs  . Herpes   . Depression   . GERD (gastroesophageal reflux disease)   . Anxiety     Past Surgical History  Procedure Date  . Colonoscopy   . Cholecystectomy 12/05/2011    Procedure: LAPAROSCOPIC CHOLECYSTECTOMY;  Surgeon: Shelly Rubenstein, MD;  Location: [facility] OR;  Service: General;  Laterality: N/A;    Family History  Problem Relation Age of Onset  . Hypertension Father   . Stroke Father   . Cancer Maternal  Uncle     esophageal  . Hypertension Maternal Grandmother   . Anesthesia problems Neg Hx   . Hypotension Neg Hx   . Malignant hyperthermia Neg Hx   . Pseudochol deficiency Neg Hx     History  Substance Use Topics  . Smoking status: Former Smoker -- 1.0 packs/day for 10 years    Types: Cigarettes  . Smokeless tobacco: Never Used     Comment: Quit 06/07/12.  using patches  . Alcohol Use: Yes     Comment: daily- "really drunk yesterday"- 12/04/2011    OB History    Grav Para Term Preterm Abortions TAB SAB Ect Mult Living                  Review of Systems  Constitutional: Positive for fatigue. Negative for fever and diaphoresis.  HENT: Negative for neck pain and neck stiffness.   Eyes: Negative for visual disturbance.  Respiratory: Negative for apnea, chest tightness and shortness of breath.   Cardiovascular: Negative for chest pain and palpitations.  Gastrointestinal: Positive for vomiting and abdominal pain. Negative for nausea, diarrhea, constipation and abdominal distention.       Food contents, no blood, no bile  Genitourinary: Negative for dysuria and flank pain.  Musculoskeletal: Negative for gait problem.  Skin: Negative for rash.  Neurological: Negative for dizziness, weakness, light-headedness, numbness and headaches.    Allergies  Pantoprazole sodium  Home Medications   Current Outpatient Rx  Name  Route  Sig  Dispense  Refill  . ALBUTEROL SULFATE HFA 108 (90 BASE) MCG/ACT IN AERS   Inhalation   Inhale 2 puffs into the lungs every 6 (six) hours as needed. For shortness of breath         . CLONAZEPAM 1 MG PO TABS   Oral   Take 1 mg by mouth 3 (three) times daily as needed. For anxiety         . GILDESS FE 1.5/30 1.5-30 MG-MCG PO TABS   Oral   Take 1 tablet by mouth daily.          Marland Kitchen HYDROCODONE-ACETAMINOPHEN 5-325 MG PO TABS   Oral   Take 1 tablet by mouth every 6 (six) hours as needed. For pain         . LAMOTRIGINE 150 MG PO TABS   Oral    Take 150 mg by mouth daily.         . MELOXICAM 7.5 MG PO TABS   Oral   Take 7.5 mg by mouth daily as needed. For pain         . NITROFURANTOIN MONOHYD MACRO 100 MG PO CAPS      TAKE 1 CAPSULE BY MOUTH WITH FOOD AS NEEDED AFTER INTERCOURSE   90 capsule   0   . OMEPRAZOLE 20 MG PO CPDR   Oral   Take 20 mg by mouth daily.         . OSELTAMIVIR PHOSPHATE 75 MG PO CAPS   Oral   Take 1 capsule (75 mg total) by mouth 2 (two) times daily.   10 capsule   0   . TIZANIDINE HCL 4 MG PO CAPS   Oral   Take 4 mg by mouth 3 (three) times daily as needed. For muscle spasms         . VALACYCLOVIR HCL 500 MG PO TABS   Oral   Take 500 mg by mouth daily.           BP 135/97  Pulse 104  Temp 98.9 F (37.2 C) (Oral)  Resp 20  SpO2 98%  Physical Exam  Nursing note and vitals reviewed. Constitutional: She is oriented to person, place, and time. She appears well-developed and well-nourished. No distress.  HENT:  Head: Normocephalic and atraumatic.  Eyes: EOM are normal. Pupils are equal, round, and reactive to light.  Neck: Normal range of motion. Neck supple.       No meningeal signs  Cardiovascular: Normal rate, regular rhythm and normal heart sounds.  Exam reveals no gallop and no friction rub.   No murmur heard. Pulmonary/Chest: Effort normal and breath sounds normal. No respiratory distress. She has no wheezes. She has no rales. She exhibits no tenderness.  Abdominal: Soft. Bowel sounds are normal. She exhibits no distension. There is tenderness. There is no rebound and no guarding.       Mild diffuse tenderness  Musculoskeletal: Normal range of motion. She exhibits no edema and no tenderness.  Neurological: She is alert and oriented to person, place, and time. No cranial nerve deficit.  Skin: Skin is warm and dry. She is not diaphoretic. No erythema.    ED Course  Procedures (including critical care time)  Labs Reviewed  COMPREHENSIVE METABOLIC PANEL - Abnormal;  Notable for the following:    Albumin 3.2 (*)     AST 79 (*)     ALT 84 (*)     All other components within normal limits  CBC WITH DIFFERENTIAL  URINALYSIS, ROUTINE W REFLEX MICROSCOPIC    Results for orders placed during the hospital encounter of 10/30/12  URINALYSIS, ROUTINE W REFLEX MICROSCOPIC      Component Value Range   Color, Urine AMBER (*) YELLOW   APPearance CLOUDY (*) CLEAR   Specific Gravity, Urine 1.038 (*) 1.005 - 1.030   pH 6.0  5.0 - 8.0   Glucose, UA NEGATIVE  NEGATIVE mg/dL   Hgb urine dipstick MODERATE (*) NEGATIVE   Bilirubin Urine SMALL (*) NEGATIVE   Ketones, ur 15 (*) NEGATIVE mg/dL   Protein, ur 30 (*) NEGATIVE mg/dL   Urobilinogen, UA 1.0  0.0 - 1.0 mg/dL   Nitrite NEGATIVE  NEGATIVE   Leukocytes, UA NEGATIVE  NEGATIVE  COMPREHENSIVE METABOLIC PANEL      Component Value Range   Sodium 135  135 - 145 mEq/L   Potassium 3.5  3.5 - 5.1 mEq/L   Chloride 102  96 - 112 mEq/L   CO2 20  19 - 32 mEq/L   Glucose, Bld 94  70 - 99 mg/dL   BUN 9  6 - 23 mg/dL   Creatinine, Ser 1.61  0.50 - 1.10 mg/dL   Calcium 8.5  8.4 - 09.6 mg/dL   Total Protein 6.6  6.0 - 8.3 g/dL   Albumin 3.2 (*) 3.5 - 5.2 g/dL   AST 79 (*) 0 - 37 U/L   ALT 84 (*) 0 - 35 U/L   Alkaline Phosphatase 102  39 - 117 U/L   Total Bilirubin 0.3  0.3 - 1.2 mg/dL   GFR calc non Af Amer >90  >90 mL/min   GFR calc Af Amer >90  >90 mL/min  CBC WITH DIFFERENTIAL      Component Value Range   WBC 8.0  4.0 - 10.5 K/uL   RBC 4.37  3.87 - 5.11 MIL/uL   Hemoglobin 14.6  12.0 - 15.0 g/dL   HCT 04.5  40.9 - 81.1 %   MCV 95.7  78.0 - 100.0 fL   MCH 33.4  26.0 - 34.0 pg   MCHC 34.9  30.0 - 36.0 g/dL   RDW 91.4  78.2 - 95.6 %   Platelets 226  150 - 400 K/uL   Neutrophils Relative 66  43 - 77 %   Neutro Abs 5.2  1.7 - 7.7 K/uL   Lymphocytes Relative 29  12 - 46 %   Lymphs Abs 2.3  0.7 - 4.0 K/uL   Monocytes Relative 4  3 - 12 %   Monocytes Absolute 0.3  0.1 - 1.0 K/uL   Eosinophils Relative 1  0 - 5  %   Eosinophils Absolute 0.1  0.0 - 0.7 K/uL   Basophils Relative 0  0 - 1 %   Basophils Absolute 0.0  0.0 - 0.1 K/uL  POCT PREGNANCY, URINE      Component Value Range   Preg Test, Ur NEGATIVE  NEGATIVE  URINE MICROSCOPIC-ADD ON      Component Value Range   Squamous Epithelial / LPF MANY (*) RARE   WBC, UA 3-6  <3 WBC/hpf   RBC / HPF 3-6  <3 RBC/hpf   Bacteria, UA FEW (*) RARE   Urine-Other MUCOUS PRESENT     No results found.   No diagnosis found.    MDM  With the exception of some slightly elevated enzymes, lab work and urinalysis was unremarkable. Had a chance to discuss test results with pt, but pt left before discharge instructions were  given.  Glade Nurse, PA-C 10/31/12 1144

## 2012-10-30 NOTE — ED Notes (Signed)
Dr. Ignacia Palma and Reola Calkins PA at bedside.

## 2012-10-30 NOTE — ED Notes (Signed)
Wed dx. With flu by pcp. Prescribed tamaflu. Not helping. Worsening upper abd. Pain. No n/v, had diarrhea. Fatiqued.

## 2012-10-30 NOTE — ED Notes (Signed)
Pt states she started having pain in her epigastric area described as pressure in nature that radiated to left breast. Pt rates pain 3/10 and states it's really uncomfortable. Pt also c/o nausea and vomiting, she stopped vomiting yesterday but continues to have nausea.  She was diagnosed with influenza on Wednesday by pcp and was given tamaflu but states she feels like she isn't getting any better.

## 2012-10-31 NOTE — ED Provider Notes (Signed)
Medical screening examination/treatment/procedure(s) were conducted as a shared visit with non-physician practitioner(s) and myself.  I personally evaluated the patient during the encounter Pt with myalgias, N&V for several days.  Seen 3 days ago and treated for flu by her PCP with Tamiflu.  Exam and labs today showed minimally elevated AST and ALT, otherwise were normal.  Regard her illness as being a viral illness.  Carleene Cooper III, MD 10/31/12 [phone]

## 2012-11-01 ENCOUNTER — Telehealth: Payer: Self-pay | Admitting: Family Medicine

## 2012-11-01 LAB — URINE CULTURE: Colony Count: 75000

## 2012-11-01 NOTE — Telephone Encounter (Signed)
Spoke with patient.  She says she went to [facility] ER on Saturday.  States they were very rude to her, didn't address the pain she was having, told her to follow up with her PCP.  She says the pain did go away on it's own yesterday.  She says she was told that her liver enzymes were elevated and that she should follow up with you about that.  Will you please review and advise.

## 2012-11-01 NOTE — Telephone Encounter (Signed)
[facility] Triage Call Report Triage Record Num: [id number] Operator: Alphonsa Overall Patient Name: Nicole [name] Call Date & Time: 10/30/2012 5:48:08PM Patient Phone: [phone] PCP: Ruthe Mannan Patient Gender: Female PCP Fax : [phone] Patient DOB: [birth year] Practice Name: [facility] Reason for Call: No menses. Nicole [name] calling about chest pain and flu symptoms. Onset 10/27/12 diagnosed and given Tamiflu. Weak, pressure in center of chest, nauseated. Breathing ok. Afebrile. Chest pressure 10/30/12. Breathing ok for pt. Pt taking medications. History of anxiety. Mild headache-taking Acetaminophen helps some. Moderate pain with productive cough. Care advice given per Chest Pain Protocol. [name] will go to [facility] ED for evaluation. Protocol(s) Used: Chest Pain Recommended Outcome per Protocol: See Provider within 4 hours Reason for Outcome: Moderate to severe pain occurring with deep breath or a productive cough for one full day or more Care Advice: ~ Use a cool mist humidifier to moisten air. Be sure to clean according to manufacturer's instructions. ~ Limit activities and increase periods of rest. Call EMS 911 if sudden worsening of breathing problems, dusky or blue color to skin, continuous chest pain, weakness, or confusion occurs. ~ ~ IMMEDIATE ACTION ~ List, or take, all current prescription(s), nonprescription or alternative medication(s) to provider for evaluation. Most adults need to drink 6-10 eight-ounce glasses (1.2-2.0 liters) of fluids per day unless previously told to limit fluid intake for other medical reasons. Limit fluids that contain caffeine, sugar or alcohol. Urine will be a very light yellow color when you drink enough fluids. ~ Analgesic/Antipyretic Advice - Acetaminophen: Consider acetaminophen as directed on label or by pharmacist/provider for pain or fever PRECAUTIONS: - Use if there is no history of liver disease, alcoholism, or intake of  three or more alcohol drinks per day - Only if approved by provider during pregnancy or when breastfeeding - During pregnancy, acetaminophen should not be taken more than 3 consecutive days without telling provider - Do not exceed recommended dose or frequency ~ 10/30/2012 6:09:02PM Page 1 of 1 [id number]

## 2012-11-01 NOTE — Telephone Encounter (Signed)
Please call to check on pt. 

## 2012-11-01 NOTE — Telephone Encounter (Signed)
Yes they were a little elevated band were completely normal when we checked them a few months ago.   Any new medications?  Please have her come in for office visit so we can address this.

## 2012-11-02 NOTE — Telephone Encounter (Signed)
Left message asking pt to call to schedule visit or call if questions.

## 2012-11-03 NOTE — Telephone Encounter (Signed)
Spoke with patient.  She's taking mobic 7.5 mg's twice a day, vicodin 5/325 at bedtime, zanaflex 4 mg's, one half to one every 6 hours as needed.  She's been taking these meds for one week, but not everyday.  She states she does drink about at least about 3 to 6 beers a day, until the last couple of weeks.

## 2012-11-03 NOTE — Telephone Encounter (Signed)
Left another message asking patient to schedule appt or let us know if she wants to hold off for awhile.

## 2012-11-04 NOTE — Telephone Encounter (Signed)
Tylenol in vicodin when only taking it once a day should not be causing this.  That many beers per day could definitely effect her liver enzymes.

## 2012-11-04 NOTE — Telephone Encounter (Signed)
Advised patient.  Appt scheduled for next week.

## 2012-11-10 ENCOUNTER — Ambulatory Visit: Payer: 59 | Admitting: Family Medicine

## 2012-11-10 ENCOUNTER — Encounter: Payer: Self-pay | Admitting: Family Medicine

## 2012-11-10 ENCOUNTER — Ambulatory Visit (INDEPENDENT_AMBULATORY_CARE_PROVIDER_SITE_OTHER): Payer: 59 | Admitting: Family Medicine

## 2012-11-10 VITALS — BP 132/82 | HR 95 | Temp 98.3°F | Ht 64.5 in | Wt 154.0 lb

## 2012-11-10 DIAGNOSIS — K589 Irritable bowel syndrome without diarrhea: Secondary | ICD-10-CM

## 2012-11-10 DIAGNOSIS — R7401 Elevation of levels of liver transaminase levels: Secondary | ICD-10-CM

## 2012-11-10 DIAGNOSIS — R748 Abnormal levels of other serum enzymes: Secondary | ICD-10-CM | POA: Insufficient documentation

## 2012-11-10 LAB — HEPATIC FUNCTION PANEL
ALT: 14 U/L (ref 0–35)
AST: 17 U/L (ref 0–37)
Albumin: 3.5 g/dL (ref 3.5–5.2)
Total Bilirubin: 0.4 mg/dL (ref 0.3–1.2)
Total Protein: 6.8 g/dL (ref 6.0–8.3)

## 2012-11-10 LAB — LIPASE: Lipase: 23 U/L (ref 11.0–59.0)

## 2012-11-10 NOTE — Progress Notes (Signed)
Subjective:    Patient ID: Nicole [name], female    DOB: March 18, 1981, 32 y.o.   MRN: [id number]  HPI Pt here to discuss elevated liver enzymes found during ER visit  Has never had elevated lft in the past  Never had pancreatitis  Takes norco 5-325 and occ tylenol  No more than 4 per day - takes no more than 2 pills at a time and no less than 4 hours in between Never takes norco on a day she also takes tylenol  On a bad day - takes 1 norco a day (for back pain )    Lab Results  Component Value Date   ALT 84* 10/30/2012   AST 79* 10/30/2012   ALKPHOS 102 10/30/2012   BILITOT 0.3 10/30/2012     Chemistry      Component Value Date/Time   NA 135 10/30/2012 1935   K 3.5 10/30/2012 1935   CL 102 10/30/2012 1935   CO2 20 10/30/2012 1935   BUN 9 10/30/2012 1935   CREATININE 0.75 10/30/2012 1935   CREATININE 0.80 07/30/2012 1608      Component Value Date/Time   CALCIUM 8.5 10/30/2012 1935   ALKPHOS 102 10/30/2012 1935   AST 79* 10/30/2012 1935   ALT 84* 10/30/2012 1935   BILITOT 0.3 10/30/2012 1935      Lab Results  Component Value Date   WBC 8.0 10/30/2012   HGB 14.6 10/30/2012   HCT 41.8 10/30/2012   MCV 95.7 10/30/2012   PLT 226 10/30/2012     She was there for abd pain -  But left before work up was finished   Alcohol- was drinking 4-6 servings per day beer  She has cut down to just drinking on the weekends - max amt on a weekend day  Has some alcohol yesterday - 2 beers  No withdrawal symptoms if she does not drink  Does not think she has any problem with alcohol at all - does think she drinks too much however She has quit before and plans to do so now  financee drinks a lot - that is problematic -will not be able to get alcohol out of the house and he will not quit   Never had hepatitis of any kind  abd pain is improved - thinks it was her IBS  Diarrhea predominant  occ takes otc anti diarrhea med    Patient Active Problem List  Diagnosis  . Symptomatic cholelithiasis    . Depression  . GERD (gastroesophageal reflux disease)  . Anxiety  . B12 deficiency  . Community acquired pneumonia  . Routine general medical examination at a health care facility  . Influenza  . Elevated liver enzymes   Past Medical History  Diagnosis Date  . IBS (irritable bowel syndrome)   . Urinary tract infection     hx of UTIs  . Herpes   . Depression   . GERD (gastroesophageal reflux disease)   . Anxiety    Past Surgical History  Procedure Date  . Colonoscopy   . Cholecystectomy 12/05/2011    Procedure: LAPAROSCOPIC CHOLECYSTECTOMY;  Surgeon: Shelly Rubenstein, MD;  Location: [facility] OR;  Service: General;  Laterality: N/A;   History  Substance Use Topics  . Smoking status: Former Smoker -- 1.0 packs/day for 10 years    Types: Cigarettes  . Smokeless tobacco: Never Used     Comment: Quit 06/07/12.  using patches  . Alcohol Use: Yes     Comment: daily- "  really drunk yesterday"- 12/04/2011   Family History  Problem Relation Age of Onset  . Hypertension Father   . Stroke Father   . Cancer Maternal Uncle     esophageal  . Hypertension Maternal Grandmother   . Anesthesia problems Neg Hx   . Hypotension Neg Hx   . Malignant hyperthermia Neg Hx   . Pseudochol deficiency Neg Hx    Allergies  Allergen Reactions  . Pantoprazole Sodium Other (See Comments)    Made acid reflux worse   Current Outpatient Prescriptions on File Prior to Visit  Medication Sig Dispense Refill  . albuterol (PROVENTIL HFA;VENTOLIN HFA) 108 (90 BASE) MCG/ACT inhaler Inhale 2 puffs into the lungs every 6 (six) hours as needed. For shortness of breath      . clonazePAM (KLONOPIN) 1 MG tablet Take 1 mg by mouth 3 (three) times daily as needed. For anxiety      . GILDESS FE 1.5/30 1.5-30 MG-MCG tablet Take 1 tablet by mouth daily.       Marland Kitchen HYDROcodone-acetaminophen (NORCO/VICODIN) 5-325 MG per tablet Take 1 tablet by mouth every 6 (six) hours as needed. For pain      . lamoTRIgine (LAMICTAL) 150 MG  tablet Take 150 mg by mouth daily.      . meloxicam (MOBIC) 7.5 MG tablet Take 7.5 mg by mouth daily as needed. For pain      . nitrofurantoin, macrocrystal-monohydrate, (MACROBID) 100 MG capsule TAKE 1 CAPSULE BY MOUTH WITH FOOD AS NEEDED AFTER INTERCOURSE  90 capsule  0  . omeprazole (PRILOSEC) 20 MG capsule Take 20 mg by mouth daily.      Marland Kitchen tiZANidine (ZANAFLEX) 4 MG capsule Take 4 mg by mouth 3 (three) times daily as needed. For muscle spasms      . valACYclovir (VALTREX) 500 MG tablet Take 500 mg by mouth daily.            Review of Systems Review of Systems  Constitutional: Negative for fever, appetite change, fatigue and unexpected weight change.  Eyes: Negative for pain and visual disturbance.  Respiratory: Negative for cough and shortness of breath.   Cardiovascular: Negative for cp or palpitations    Gastrointestinal: Negative for nausea, constipation/abd pain/ blood in stool or jaundice  Genitourinary: Negative for urgency and frequency.  Skin: Negative for pallor or rash   Neurological: Negative for weakness, light-headedness, numbness and headaches. neg for tremor  Hematological: Negative for adenopathy. Does not bruise/bleed easily.  Psychiatric/Behavioral: Negative for dysphoric mood. The patient is anxious         Objective:   Physical Exam  Constitutional: She appears well-developed and well-nourished. No distress.  HENT:  Head: Normocephalic and atraumatic.  Right Ear: External ear normal.  Left Ear: External ear normal.  Nose: Nose normal.  Mouth/Throat: Oropharynx is clear and moist.  Eyes: Conjunctivae normal and EOM are normal. Pupils are equal, round, and reactive to light. Right eye exhibits no discharge. Left eye exhibits no discharge. No scleral icterus.       No icterus  Neck: Normal range of motion. Neck supple. No JVD present. Carotid bruit is not present. No thyromegaly present.  Cardiovascular: Normal rate, regular rhythm, normal heart sounds and  intact distal pulses.  Exam reveals no gallop.   Pulmonary/Chest: Effort normal and breath sounds normal. No respiratory distress. She has no wheezes.  Abdominal: Soft. Bowel sounds are normal. She exhibits no distension, no abdominal bruit and no mass. There is no tenderness. There is no  rebound, no guarding and negative Murphy's sign.  Musculoskeletal: She exhibits no edema.  Lymphadenopathy:    She has no cervical adenopathy.  Neurological: She is alert. She has normal reflexes. She displays no tremor. No cranial nerve deficit. She exhibits normal muscle tone. Coordination normal.  Skin: Skin is warm and dry. No rash noted. No erythema. No pallor.       No jaundice   Psychiatric: Her speech is normal and behavior is normal. Thought content normal. Her mood appears anxious.       Pt seems mildly anxious but pleasant          Assessment & Plan:

## 2012-11-10 NOTE — Assessment & Plan Note (Signed)
Intermittent , diarrhea predominant - pt thinks this caused her abd pain in ER-now resolved Disc trial of fiber- in small amounts (metamucil/ citrucel) - on a schedule  Will update if symptoms worsen

## 2012-11-10 NOTE — Assessment & Plan Note (Signed)
After long discussion - suspect this may be due to excessive alcohol use at the time - pt denies dependence or hx of withdrawal She is drinking less now  Re check labs Also lipase with recent hx of abd pain  Also hepatitis panel - exp neg  Will go ahead and quit drinking alcohol entirely and minimize med with tylenol  Update with result  Note-pt has had ccy in the past and exam today is reassuring >25 min spent with face to face with patient, >50% counseling and/or coordinating care

## 2012-11-10 NOTE — Patient Instructions (Addendum)
Try to avoid products with tylenol (acetaminophen) as much as possible Try small dose of fiber for IBS Labs today  Do your best to quit alcohol entirely  If abdmominal pain returns let us know

## 2012-11-11 LAB — HEPATITIS PANEL, ACUTE
HCV Ab: NEGATIVE
Hepatitis B Surface Ag: NEGATIVE

## 2012-11-27 ENCOUNTER — Other Ambulatory Visit: Payer: Self-pay

## 2012-12-15 ENCOUNTER — Telehealth: Payer: Self-pay | Admitting: Family Medicine

## 2012-12-15 NOTE — Telephone Encounter (Signed)
Patient Information:  Caller Name: Nicole [name]  Phone: [phone]  Patient: Nicole, [name]  Gender: Female  DOB: January 31, 1981  Age: 32 Years  PCP: Ruthe Mannan [facility])  Pregnant: No  Office Follow Up:  Does the office need to follow up with this patient?: No  Instructions For The Office: N/A   Symptoms  Reason For Call & Symptoms: Patient has questions about the vomiting virus going around.  Pt has no symptoms.  Reviewed Health History In EMR: Yes  Reviewed Medications In EMR: Yes  Reviewed Allergies In EMR: Yes  Reviewed Surgeries / Procedures: Yes  Date of Onset of Symptoms: 12/15/2012 OB / GYN:  LMP: Unknown  Guideline(s) Used:  No Protocol Available - Information Only  Disposition Per Guideline:   Home Care  Reason For Disposition Reached:   Information only question and nurse able to answer  Advice Given:  Call Back If:  New symptoms develop

## 2013-01-04 ENCOUNTER — Other Ambulatory Visit: Payer: Self-pay | Admitting: Family Medicine

## 2013-01-11 ENCOUNTER — Ambulatory Visit (INDEPENDENT_AMBULATORY_CARE_PROVIDER_SITE_OTHER): Payer: PRIVATE HEALTH INSURANCE | Admitting: Professional

## 2013-01-11 DIAGNOSIS — F411 Generalized anxiety disorder: Secondary | ICD-10-CM

## 2013-01-18 ENCOUNTER — Ambulatory Visit (INDEPENDENT_AMBULATORY_CARE_PROVIDER_SITE_OTHER): Payer: 59 | Admitting: Professional

## 2013-01-18 DIAGNOSIS — F411 Generalized anxiety disorder: Secondary | ICD-10-CM

## 2013-01-20 ENCOUNTER — Telehealth: Payer: Self-pay

## 2013-01-20 MED ORDER — FEXOFENADINE HCL 180 MG PO TABS: 180.0000 mg | ORAL_TABLET | Freq: Every day | ORAL | Status: DC

## 2013-01-20 NOTE — Telephone Encounter (Signed)
Advised patient.  She says she needs it sent in as a prescription so that her insurance will pay for it.

## 2013-01-20 NOTE — Telephone Encounter (Signed)
Noted.  Rx sent.

## 2013-01-20 NOTE — Telephone Encounter (Signed)
All allergy meds are OTC- she can try claritin or allegra.

## 2013-01-20 NOTE — Telephone Encounter (Signed)
Pt left v/m requesting med for allergies sent to [facility] [place]; pt said has discussed with Dr Dayton Martes before about allergies causing congestion to the point feels like vomiting;Please advise.

## 2013-01-25 ENCOUNTER — Ambulatory Visit (INDEPENDENT_AMBULATORY_CARE_PROVIDER_SITE_OTHER): Payer: PRIVATE HEALTH INSURANCE | Admitting: Professional

## 2013-01-25 DIAGNOSIS — F411 Generalized anxiety disorder: Secondary | ICD-10-CM

## 2013-02-01 ENCOUNTER — Ambulatory Visit (INDEPENDENT_AMBULATORY_CARE_PROVIDER_SITE_OTHER): Payer: PRIVATE HEALTH INSURANCE | Admitting: Professional

## 2013-02-01 DIAGNOSIS — F411 Generalized anxiety disorder: Secondary | ICD-10-CM

## 2013-02-08 ENCOUNTER — Ambulatory Visit (INDEPENDENT_AMBULATORY_CARE_PROVIDER_SITE_OTHER): Payer: PRIVATE HEALTH INSURANCE | Admitting: Professional

## 2013-02-08 DIAGNOSIS — F411 Generalized anxiety disorder: Secondary | ICD-10-CM

## 2013-02-22 ENCOUNTER — Ambulatory Visit (INDEPENDENT_AMBULATORY_CARE_PROVIDER_SITE_OTHER): Payer: PRIVATE HEALTH INSURANCE | Admitting: Professional

## 2013-02-22 DIAGNOSIS — F411 Generalized anxiety disorder: Secondary | ICD-10-CM

## 2013-03-15 ENCOUNTER — Ambulatory Visit (INDEPENDENT_AMBULATORY_CARE_PROVIDER_SITE_OTHER): Payer: PRIVATE HEALTH INSURANCE | Admitting: Professional

## 2013-03-15 DIAGNOSIS — F411 Generalized anxiety disorder: Secondary | ICD-10-CM

## 2013-03-16 ENCOUNTER — Other Ambulatory Visit: Payer: Self-pay | Admitting: Gastroenterology

## 2013-03-22 ENCOUNTER — Ambulatory Visit (INDEPENDENT_AMBULATORY_CARE_PROVIDER_SITE_OTHER): Payer: PRIVATE HEALTH INSURANCE | Admitting: Professional

## 2013-03-22 DIAGNOSIS — F411 Generalized anxiety disorder: Secondary | ICD-10-CM

## 2013-04-06 ENCOUNTER — Other Ambulatory Visit: Payer: Self-pay | Admitting: Family Medicine

## 2013-07-27 ENCOUNTER — Other Ambulatory Visit: Payer: Self-pay | Admitting: Family Medicine

## 2013-08-09 ENCOUNTER — Other Ambulatory Visit: Payer: Self-pay | Admitting: Internal Medicine

## 2013-08-09 DIAGNOSIS — N390 Urinary tract infection, site not specified: Secondary | ICD-10-CM

## 2013-08-11 ENCOUNTER — Ambulatory Visit
Admission: RE | Admit: 2013-08-11 | Discharge: 2013-08-11 | Disposition: A | Discharge status: Home / Self Care | Payer: 59 | Source: Ambulatory Visit | Attending: Internal Medicine | Admitting: Internal Medicine

## 2013-08-11 DIAGNOSIS — N390 Urinary tract infection, site not specified: Secondary | ICD-10-CM

## 2013-08-11 MED ORDER — IOHEXOL 300 MG/ML  SOLN
100.0000 mL | Freq: Once | INTRAMUSCULAR | Status: AC | PRN
  Administered 2013-08-11: 100 mL via INTRAVENOUS

## 2013-08-18 ENCOUNTER — Other Ambulatory Visit: Payer: Self-pay

## 2013-10-11 ENCOUNTER — Other Ambulatory Visit: Payer: Self-pay | Admitting: Family Medicine

## 2013-10-11 NOTE — Telephone Encounter (Signed)
Pt requesting medication refill. Last ov 11/10/2012. pls advise

## 2013-10-29 ENCOUNTER — Other Ambulatory Visit: Payer: Self-pay | Admitting: Family Medicine

## 2013-10-31 NOTE — Telephone Encounter (Signed)
Last office visit 11/10/2012 with Dr. Milinda Antisower.  Ok to refill?

## 2013-12-03 ENCOUNTER — Other Ambulatory Visit: Payer: Self-pay | Admitting: Family Medicine

## 2013-12-05 NOTE — Telephone Encounter (Signed)
Last office visit 11/10/2012 with Dr. Milinda Antisower.  Ok to refill?

## 2014-01-09 ENCOUNTER — Other Ambulatory Visit: Payer: Self-pay | Admitting: Gastroenterology

## 2014-11-09 ENCOUNTER — Emergency Department (HOSPITAL_COMMUNITY)
Admission: EM | Admit: 2014-11-09 | Discharge: 2014-11-09 | Disposition: A | Discharge status: Home / Self Care | Payer: Worker's Compensation | Attending: Emergency Medicine | Admitting: Emergency Medicine

## 2014-11-09 ENCOUNTER — Encounter (HOSPITAL_COMMUNITY): Payer: Self-pay | Admitting: *Deleted

## 2014-11-09 DIAGNOSIS — Z2089 Contact with and (suspected) exposure to other communicable diseases: Secondary | ICD-10-CM | POA: Insufficient documentation

## 2014-11-09 DIAGNOSIS — K219 Gastro-esophageal reflux disease without esophagitis: Secondary | ICD-10-CM | POA: Insufficient documentation

## 2014-11-09 DIAGNOSIS — Z8619 Personal history of other infectious and parasitic diseases: Secondary | ICD-10-CM | POA: Insufficient documentation

## 2014-11-09 DIAGNOSIS — Z209 Contact with and (suspected) exposure to unspecified communicable disease: Secondary | ICD-10-CM

## 2014-11-09 DIAGNOSIS — Z791 Long term (current) use of non-steroidal anti-inflammatories (NSAID): Secondary | ICD-10-CM | POA: Diagnosis not present

## 2014-11-09 DIAGNOSIS — Z79899 Other long term (current) drug therapy: Secondary | ICD-10-CM | POA: Insufficient documentation

## 2014-11-09 DIAGNOSIS — Z87891 Personal history of nicotine dependence: Secondary | ICD-10-CM | POA: Diagnosis not present

## 2014-11-09 DIAGNOSIS — Z23 Encounter for immunization: Secondary | ICD-10-CM

## 2014-11-09 DIAGNOSIS — F329 Major depressive disorder, single episode, unspecified: Secondary | ICD-10-CM | POA: Diagnosis not present

## 2014-11-09 DIAGNOSIS — F419 Anxiety disorder, unspecified: Secondary | ICD-10-CM | POA: Diagnosis not present

## 2014-11-09 DIAGNOSIS — Z87440 Personal history of urinary (tract) infections: Secondary | ICD-10-CM | POA: Insufficient documentation

## 2014-11-09 LAB — POC URINE PREG, ED: Preg Test, Ur: NEGATIVE

## 2014-11-09 MED ORDER — TETANUS-DIPHTH-ACELL PERTUSSIS 5-2.5-18.5 LF-MCG/0.5 IM SUSP
0.5000 mL | Freq: Once | INTRAMUSCULAR | Status: AC
  Administered 2014-11-09: 0.5 mL via INTRAMUSCULAR
  Filled 2014-11-09: qty 0.5

## 2014-11-09 MED ORDER — RABIES IMMUNE GLOBULIN 150 UNIT/ML IM INJ
20.0000 [IU]/kg | INJECTION | Freq: Once | INTRAMUSCULAR | Status: AC
  Administered 2014-11-09: 1425 [IU] via INTRAMUSCULAR
  Filled 2014-11-09: qty 9.5

## 2014-11-09 MED ORDER — RABIES VACCINE, PCEC IM SUSR
1.0000 mL | Freq: Once | INTRAMUSCULAR | Status: AC
  Administered 2014-11-09: 1 mL via INTRAMUSCULAR
  Filled 2014-11-09: qty 1

## 2014-11-09 NOTE — ED Notes (Signed)
Declined W/C at D/C and was escorted to lobby by RN. 

## 2014-11-09 NOTE — ED Notes (Addendum)
PT rep[orts while she was out side of  work @ 0700 and a  bat attached to her LT thumb. No visible signs of bite.

## 2014-11-09 NOTE — ED Notes (Addendum)
Pt stated she will be going to [facility] [employer]  [facility] for follow up injections. Pt verbalizes the understanding to follow up on 11-09-14 , 11-12-14 ,11-16-14 ,ans 11-23-14 . Pt has a copy of Rabies Vaccine.

## 2014-11-09 NOTE — Discharge Instructions (Signed)
Please call your doctor for a followup appointment within 24-48 hours. When you talk to your doctor please let them know that you were seen in the emergency department and have them acquire all of your records so that they can discuss the findings with you and formulate a treatment plan to fully care for your new and ongoing problems. Please follow-up with your primary care provider to be seen and reassessed Please rest and stay hydrated Please continue to monitor symptoms closely and if symptoms are to worsen or change (fever greater than 101, chills, sweating, nausea, vomiting, chest pain, shortness of breathe, difficulty breathing, weakness, numbness, tingling, worsening or changes to pain pattern, swelling to the finger, red streaks, bleeding, drainage, loss of sensation) please report back to the Emergency Department immediately.   Next rabies vaccine: Second dose 11/12/2014 Third dose: 11/16/2014 Fourth dose: 11/23/2014  Rabies Vaccine suspension for injection What is this medicine? RABIES VACCINE (ray BEES vax EEN) is used to prevent rabies infection. Rabies is mostly a disease of animals. Humans may get rabies if they are bitten by animals that have rabies. The vaccine may be given to protect someone with a high risk of rabies or it may be given to someone after they have been exposed. This medicine may be used for other purposes; ask your health care provider or pharmacist if you have questions. COMMON BRAND NAME(S): Imovax, RabAvert What should I tell my health care provider before I take this medicine? They need to know if you have any of these conditions: -bleeding disorder -cancer -HIV or AIDS -immune system problems -low blood counts, like low white cell, platelet, or red cell counts -recent or ongoing radiation therapy -take medicines that treat or prevent blood clots -an unusual or allergic reaction to vaccines, albumin, eggs, neomycin, other medicines, foods, dyes, or  preservatives -pregnant or trying to get pregnant -breast-feeding How should I use this medicine? This vaccine is for injection into a muscle. It is given by a health care professional. A copy of Vaccine Information Statements will be given before each vaccination. Read this sheet carefully each time. The sheet may change frequently. Talk to your pediatrician regarding the use of this medicine in children. While this drug may be prescribed for children and infants, precautions do apply. Overdosage: If you think you've taken too much of this medicine contact a poison control center or emergency room at once. Overdosage: If you think you have taken too much of this medicine contact a poison control center or emergency room at once. NOTE: This medicine is only for you. Do not share this medicine with others. What if I miss a dose? Keep appointments for follow-up doses as directed. It is important not to miss your dose. Call your doctor or health care professional if you are unable to keep an appointment. All of the vaccine doses must be given in order to provide proper protection. What may interact with this medicine? -antimalarial drugs -etanercept -immune globulins -infliximab -medicines for organ transplant -medicines to treat cancer -other vaccines -some medicines for arthritis -steroid medicines like prednisone or cortisone This list may not describe all possible interactions. Give your health care provider a list of all the medicines, herbs, non-prescription drugs, or dietary supplements you use. Also tell them if you smoke, drink alcohol, or use illegal drugs. Some items may interact with your medicine. What should I watch for while using this medicine? This vaccine, like all vaccines, may not fully protect everyone. What side effects may  I notice from receiving this medicine? Side effects that you should report to your doctor or health care professional as soon as possible: -allergic  reactions like skin rash, itching or hives, swelling of the face, lips, or tongue -changes in vision -joint pain with fever -pain, tingling, numbness in the hands or feet -stiff neck and sensitivity to light -unusually weak or tired Side effects that usually do not require medical attention (Report these to your doctor or health care professional if they continue or are bothersome.): -dizziness -headache -muscle aches and pains -pain, redness, itching or swelling at site where injected -stomach pain -tiredness This list may not describe all possible side effects. Call your doctor for medical advice about side effects. You may report side effects to FDA at 1-800-FDA-1088. Where should I keep my medicine? This drug is given in a hospital or clinic and will not be stored at home. NOTE: This sheet is a summary. It may not cover all possible information. If you have questions about this medicine, talk to your doctor, pharmacist, or health care provider.  2015, [employer] Standard. (2009-12-17 15:31:42)

## 2014-11-09 NOTE — ED Provider Notes (Signed)
CSN: [id number]     Arrival date & time 11/09/14  1033 History  This chart was scribed for non-physician practitioner, Raymon Mutton, PA-C, working with Juliet Rude. Rubin Payor, MD, by Ronney Lion, ED Scribe. This patient was seen in room TR10C/TR10C and the patient's care was started at 11:30 AM.    Chief Complaint  Patient presents with  . Animal Bite   The history is provided by the patient. No language interpreter was used.     HPI Comments: Nicole [name] is a 34 y.o. female with past medical history of herpes, depression, GERD, anxiety, IBS who presents to the Emergency Department complaining of a bat "attaching itself" to her left thumb this morning about 4 hours ago when she was outside at work. She states she is unsure whether it bit her, but she felt the sensation of the bat gripping onto her thumb. She denies bleeding, numbness, tingling, pain, or swelling. Patient states she is UTD on her tetanus vaccine, but was advised at a doctor's office associated with worker's compensation to receive a tetanus shot. She has NKDA. PCP none    Past Medical History  Diagnosis Date  . IBS (irritable bowel syndrome)   . Urinary tract infection     hx of UTIs  . Herpes   . Depression   . GERD (gastroesophageal reflux disease)   . Anxiety    Past Surgical History  Procedure Laterality Date  . Colonoscopy    . Cholecystectomy  12/05/2011    Procedure: LAPAROSCOPIC CHOLECYSTECTOMY;  Surgeon: Shelly Rubenstein, MD;  Location: [facility];  Service: General;  Laterality: N/A;   Family History  Problem Relation Age of Onset  . Hypertension Father   . Stroke Father   . Cancer Maternal Uncle     esophageal  . Hypertension Maternal Grandmother   . Anesthesia problems Neg Hx   . Hypotension Neg Hx   . Malignant hyperthermia Neg Hx   . Pseudochol deficiency Neg Hx    History  Substance Use Topics  . Smoking status: Former Smoker -- 1.00 packs/day for 10 years    Types: Cigarettes  . Smokeless  tobacco: Never Used     Comment: Quit 06/07/12.  using patches  . Alcohol Use: Yes     Comment: daily- "really drunk yesterday"- 12/04/2011   OB History    No data available     Review of Systems  Musculoskeletal: Negative for myalgias, joint swelling and arthralgias.  Skin: Negative for color change and wound.  Neurological: Negative for weakness and numbness.      Allergies  Pantoprazole sodium  Home Medications   Prior to Admission medications   Medication Sig Start Date End Date Taking? Authorizing Provider  albuterol (PROVENTIL HFA;VENTOLIN HFA) 108 (90 BASE) MCG/ACT inhaler Inhale 2 puffs into the lungs every 6 (six) hours as needed. For shortness of breath    Historical Provider, MD  clonazePAM (KLONOPIN) 1 MG tablet Take 1 mg by mouth 3 (three) times daily as needed. For anxiety    Historical Provider, MD  fexofenadine (ALLEGRA) 180 MG tablet Take 1 tablet (180 mg total) by mouth daily. 01/20/13   Dianne Dun, MD  GILDESS FE 1.5/30 1.5-30 MG-MCG tablet Take 1 tablet by mouth daily.  10/08/11   Historical Provider, MD  HYDROcodone-acetaminophen (NORCO/VICODIN) 5-325 MG per tablet Take 1 tablet by mouth every 6 (six) hours as needed. For pain    Historical Provider, MD  lamoTRIgine (LAMICTAL) 150 MG tablet Take 150  mg by mouth daily.    Historical Provider, MD  meloxicam (MOBIC) 7.5 MG tablet Take 7.5 mg by mouth daily as needed. For pain    Historical Provider, MD  nitrofurantoin, macrocrystal-monohydrate, (MACROBID) 100 MG capsule TAKE 1 CAPSULE BY MOUTH WITH FOOD AS NEEDED AFTER INTERCOURSE 10/11/13   Dianne Dunalia M Aron, MD  omeprazole (PRILOSEC) 20 MG capsule Take 20 mg by mouth daily.    Historical Provider, MD  tiZANidine (ZANAFLEX) 4 MG capsule Take 4 mg by mouth 3 (three) times daily as needed. For muscle spasms    Historical Provider, MD  valACYclovir (VALTREX) 500 MG tablet Take 500 mg by mouth daily.    Historical Provider, MD  valACYclovir (VALTREX) 500 MG tablet TAKE 1  TABLET EVERY 12 HOURS FOR 3 DAYS AS NEEDED 04/06/13   Dianne Dunalia M Aron, MD  valACYclovir (VALTREX) 500 MG tablet TAKE 1 TABLET EVERY 12 HOURS FOR 3 DAYS AS NEEDED 10/29/13   Dianne Dunalia M Aron, MD  valACYclovir (VALTREX) 500 MG tablet TAKE 1 TABLET EVERY 12 HOURS FOR 3 DAYS AS NEEDED    Dianne Dunalia M Aron, MD   BP 125/84 mmHg  Pulse 88  Temp(Src) 98.2 F (36.8 C) (Oral)  Resp 18  Ht 5\' 4"  (1.626 m)  Wt 154 lb (69.854 kg)  BMI 26.42 kg/m2  SpO2 100%  LMP 10/26/2014 Physical Exam  Constitutional: She is oriented to person, place, and time. She appears well-developed and well-nourished. No distress.  HENT:  Head: Normocephalic and atraumatic.  Eyes: Conjunctivae and EOM are normal. Right eye exhibits no discharge. Left eye exhibits no discharge.  Neck: Normal range of motion. Neck supple. No tracheal deviation present.  Cardiovascular: Normal rate, regular rhythm and normal heart sounds.  Exam reveals no friction rub.   No murmur heard. Pulmonary/Chest: Effort normal and breath sounds normal. No respiratory distress. She has no wheezes. She has no rales.  Musculoskeletal: Normal range of motion. She exhibits no edema or tenderness.  Full range of motion to the left upper extremity and digits of the left hand without difficulty noted. Negative swelling, erythema, lesions, sores, superficial abrasions or puncture wounds identified to the left thumb. Full range of motion to left thumb without difficulty. Negative red streaks. Negative warmth upon palpation.  Lymphadenopathy:    She has no cervical adenopathy.  Neurological: She is alert and oriented to person, place, and time. No cranial nerve deficit. She exhibits normal muscle tone. Coordination normal.  Cranial nerves III-XII grossly intact Strength 5+/5+ to upper extremities bilaterally with resistance applied, equal distribution noted Strength intact to MCP, PIP, DIP joints of left hand Sensation intact with differentiation to sharp and dull  touch Negative arm drift Fine motor skills intact  Skin: Skin is warm and dry. No rash noted. She is not diaphoretic. No erythema.  Please see musculoskeletal  Psychiatric: She has a normal mood and affect. Her behavior is normal. Thought content normal.  Nursing note and vitals reviewed.   ED Course  Procedures (including critical care time)  DIAGNOSTIC STUDIES: Oxygen Saturation is 100% on room air, normal by my interpretation.    COORDINATION OF CARE: 11:32 AM - Discussed treatment plan with pt at bedside which includes rabies vaccine and immunoglobulin injections, and pt agreed to plan.   Labs Review Labs Reviewed  POC URINE PREG, ED    Imaging Review No results found.   EKG Interpretation None      MDM   Final diagnoses:  Exposure to bat without known  bite  Need for rabies vaccination    Medications  Tdap (BOOSTRIX) injection 0.5 mL (0.5 mLs Intramuscular Given 11/09/14 1226)  rabies immune globulin (HYPERAB) injection 1,425 Units (1,425 Units Intramuscular Given 11/09/14 1247)  rabies vaccine (RABAVERT) injection 1 mL (1 mL Intramuscular Given 11/09/14 1244)    Filed Vitals:   11/09/14 1041 11/09/14 1321  BP:  125/84  Pulse: 100 88  Temp: 98.2 F (36.8 C)   TempSrc: Oral   Resp: 20 18  Height:  (1.626 m)   Weight: 154 lb (69.854 kg)   SpO2: 100% 100%   I personally performed the services described in this documentation, which was scribed in my presence. The recorded information has been reviewed and is accurate.  Urine pregnancy negative. Patient presenting to emergency department with back landing on her left thumb this morning while patient was outside smoking cigarettes. Patient reported that she was seen by [employer] physician who recommended patient to come to the ED to get rabies vaccination as well as tetanus up-to-date. Patient reported that she is unsure when her last tetanus vaccination was. Physical exam negative for acute injury-negative  findings of abrasions, lacerations, lesions, sores, cellulitic findings. Full range of motion to the digits of the left hand. Sensation intact. Strength intact. Pulses palpable and strong. Negative signs of injury. Negative focal neurological deficits. Patient given tetanus shot as well as rabies vaccination and immunoglobulin while in ED setting. Patient monitored in ED setting for approximately 30 minutes with no reaction to immunizations. Patient stable, afebrile. Patient septic appearing. Discharged patient. Patient given sheet of paper regarding when her next vaccination is to be given. Referred patient to her primary care provider to be seen and assessed in for her primary care provider to be involved care. Discussed with patient to closely monitor symptoms and if symptoms are to worsen or change to report back to the ED - strict return instructions given.  Patient agreed to plan of care, understood, all questions answered.   Raymon Mutton, PA-C 11/09/14 1339  Juliet Rude. Rubin Payor, MD 11/10/14 [phone]

## 2015-09-17 IMAGING — CT CT ABD-PELV W/ CM
2 of 4 series · 17 of 46 positions shown, 19 images · IV contrast (READICAT/WATER & [ID] OMNI 300)
Comparison: None available; prior exam 01/27/2003 predates PACs and
is no longer available for comparison.

CLINICAL DATA: UTI, low back and right flank pain, bloating,
nausea, history of irritable bowel syndrome, GERD, cholecystectomy,
smoking

EXAM:
CT ABDOMEN AND PELVIS WITH CONTRAST
TECHNIQUE: Multidetector CT imaging of the abdomen and pelvis was performed
using the standard protocol following bolus administration of
intravenous contrast. Sagittal and coronal MPR images reconstructed
from axial data set.
CONTRAST:  100mL OMNIPAQUE IOHEXOL 300 MG/ML SOLN Dilute oral
contrast.

[Series 2: abd/pelvis with · axial · 0.81mm/px · z∈[-425,+5]mm · 14 of 93 slices shown, 16 images]
[im 5/93  soft-tissue]
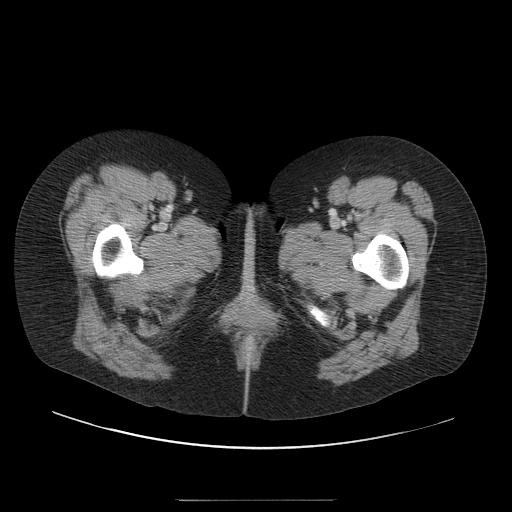
[im 5/93  bone]
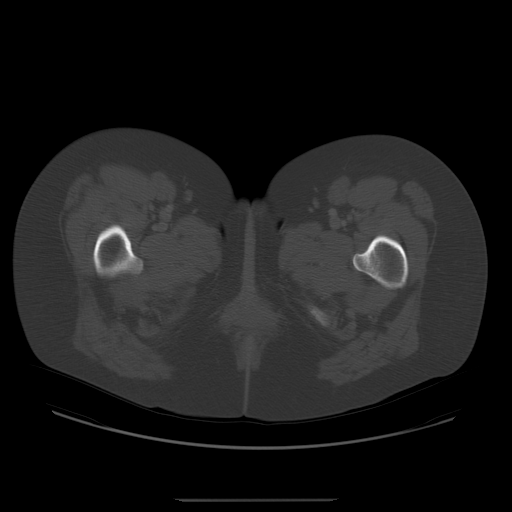
[im 13/93  soft-tissue]
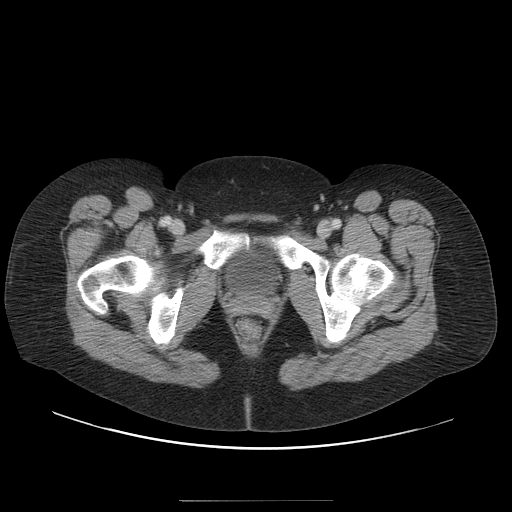
[im 17/93  soft-tissue]
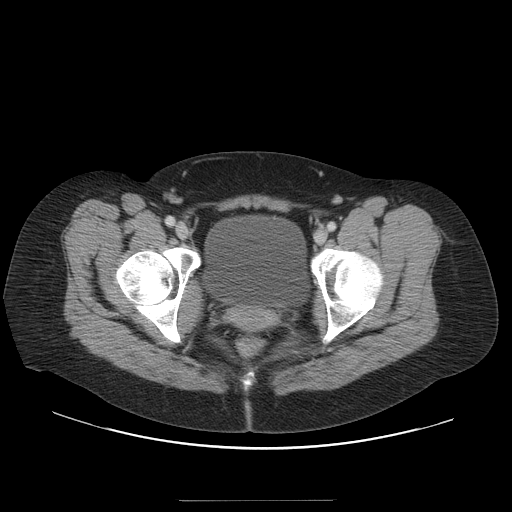
[im 25/93  soft-tissue]
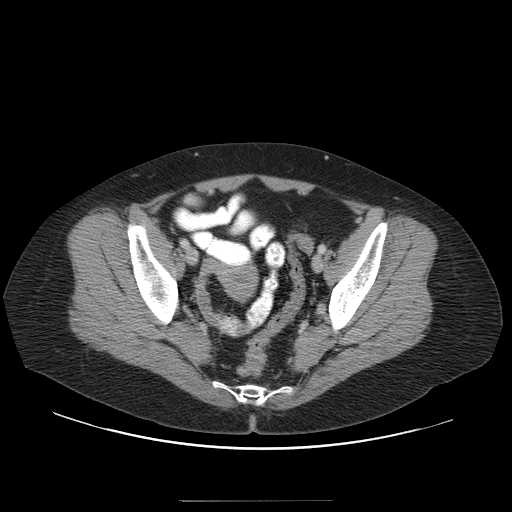
[im 33/93  soft-tissue]
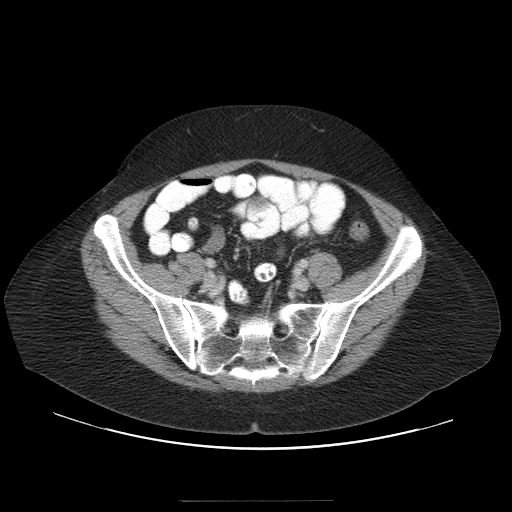
[im 37/93  soft-tissue]
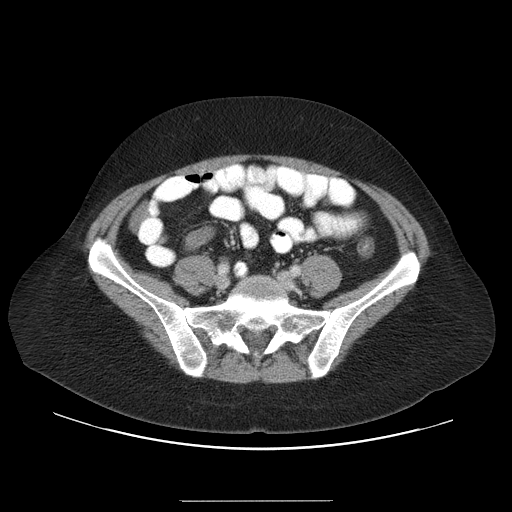
[im 45/93  soft-tissue]
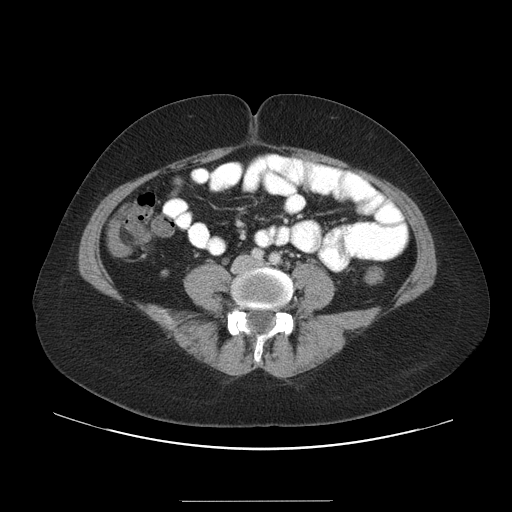
[im 49/93  soft-tissue]
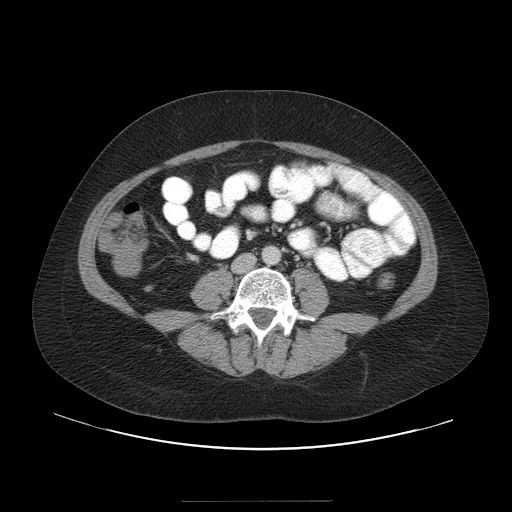
[im 57/93  soft-tissue]
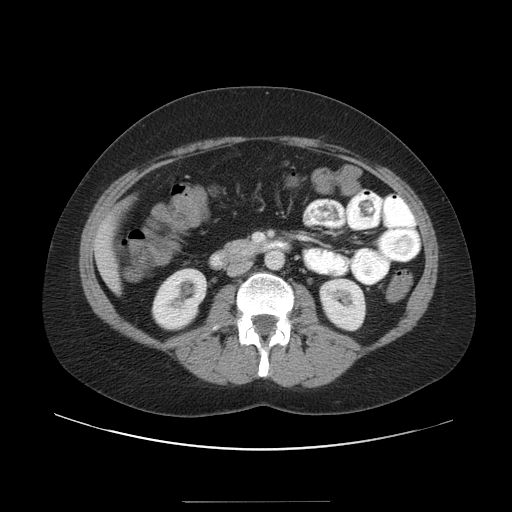
[im 57/93  bone]
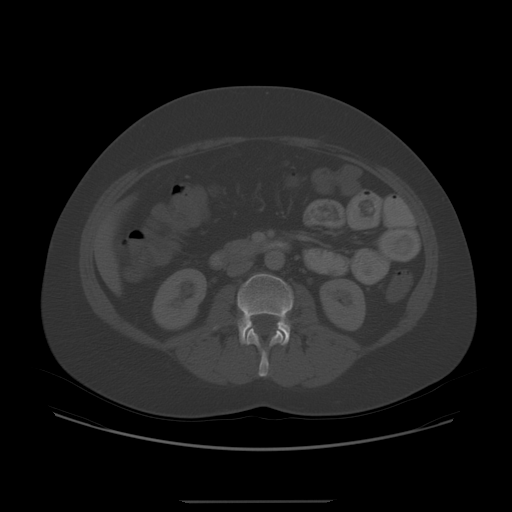
[im 61/93  soft-tissue]
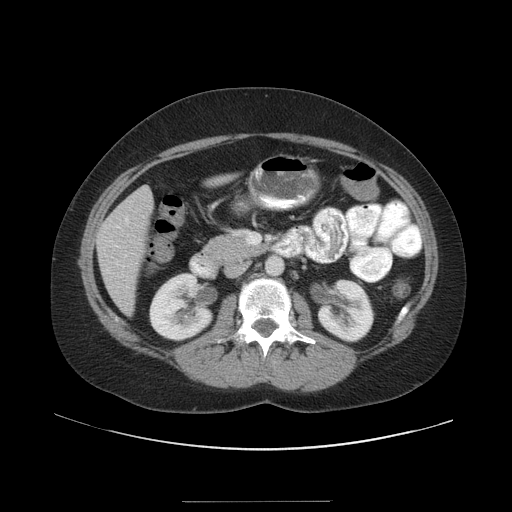
[im 69/93  soft-tissue]
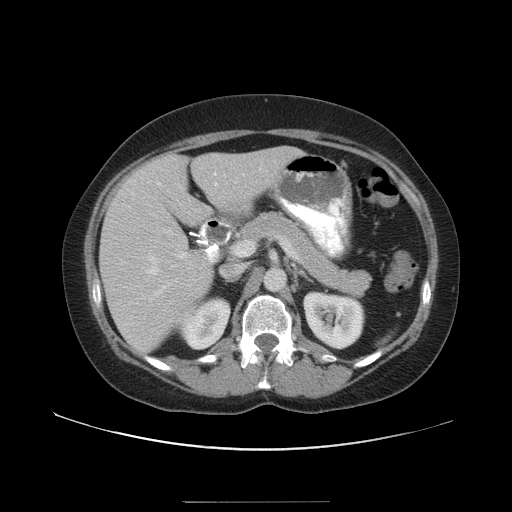
[im 77/93  soft-tissue]
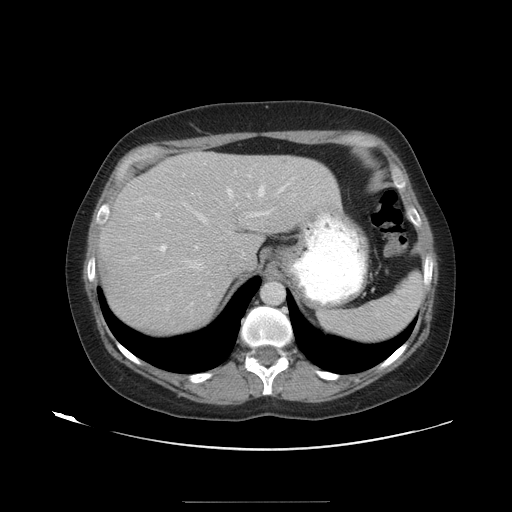
[im 81/93  soft-tissue]
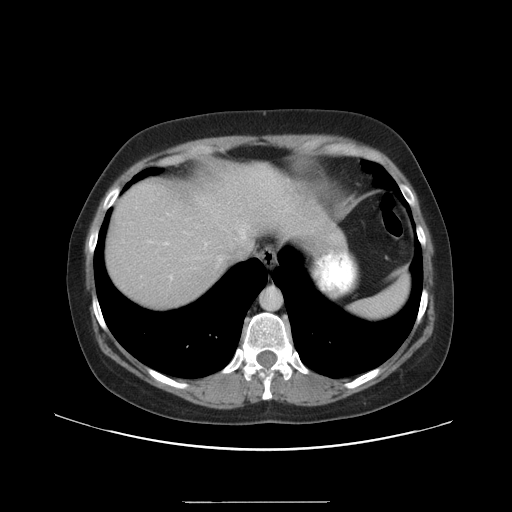
[im 89/93  soft-tissue]
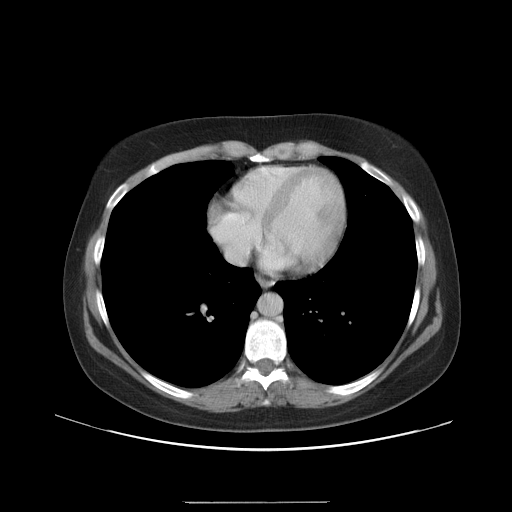

[Series 401: coronal · coronal · 0.94mm/px · 3 of 116 slices shown]
[im 39/116  soft-tissue]
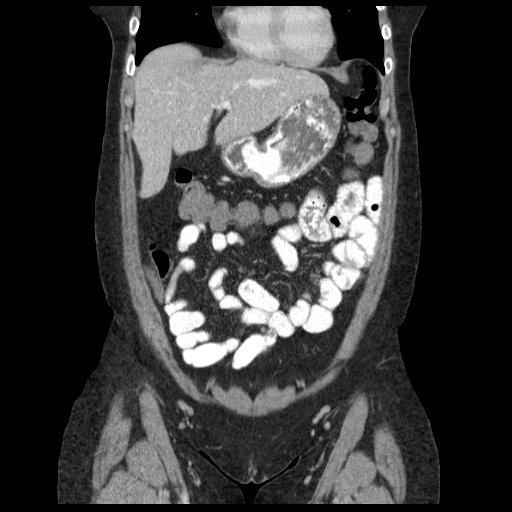
[im 52/116  soft-tissue]
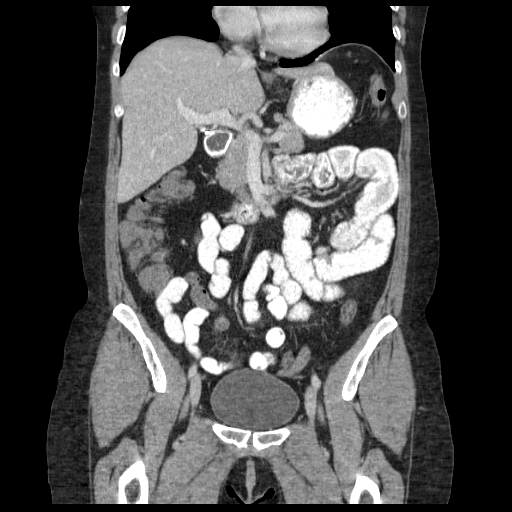
[im 64/116  soft-tissue]
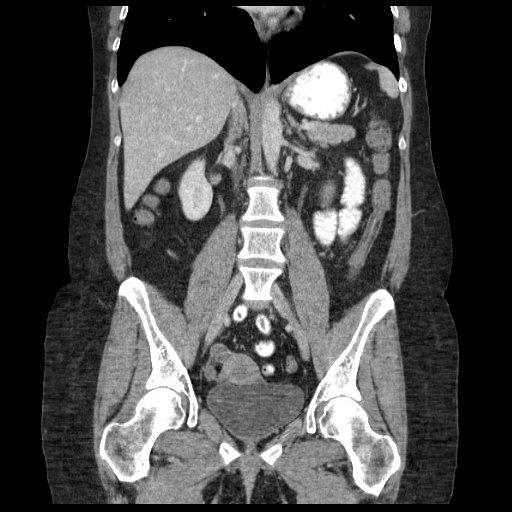

[17 of 46 positions shown; findings below may reference images not displayed]

FINDINGS: Minimal atelectasis or scarring at medial left lung base.

Gallbladder surgically absent.

Liver, spleen, pancreas, kidneys, and adrenal glands normal
appearance.

Normal appendix.

Colon and distal small bowel are not opacified, limiting assessment.

Stomach and bowel loops otherwise normal appearance.

Normal appearing bladder, ureters, uterus, and adnexae.

No mass, adenopathy, free fluid, or inflammatory process.

No hernia or acute bone lesions.
IMPRESSION: No definite acute intra-abdominal or intrapelvic abnormalities

## 2017-04-23 ENCOUNTER — Emergency Department (HOSPITAL_BASED_OUTPATIENT_CLINIC_OR_DEPARTMENT_OTHER)
Admission: EM | Admit: 2017-04-23 | Discharge: 2017-04-23 | Disposition: A | Discharge status: Home / Self Care | Payer: 59 | Attending: Emergency Medicine | Admitting: Emergency Medicine

## 2017-04-23 ENCOUNTER — Encounter (HOSPITAL_BASED_OUTPATIENT_CLINIC_OR_DEPARTMENT_OTHER): Payer: Self-pay | Admitting: *Deleted

## 2017-04-23 DIAGNOSIS — F17210 Nicotine dependence, cigarettes, uncomplicated: Secondary | ICD-10-CM | POA: Insufficient documentation

## 2017-04-23 DIAGNOSIS — Z79899 Other long term (current) drug therapy: Secondary | ICD-10-CM | POA: Diagnosis not present

## 2017-04-23 DIAGNOSIS — F419 Anxiety disorder, unspecified: Secondary | ICD-10-CM | POA: Diagnosis present

## 2017-04-23 DIAGNOSIS — Z0441 Encounter for examination and observation following alleged adult rape: Secondary | ICD-10-CM | POA: Insufficient documentation

## 2017-04-23 DIAGNOSIS — T7421XA Adult sexual abuse, confirmed, initial encounter: Secondary | ICD-10-CM | POA: Diagnosis not present

## 2017-04-23 MED ORDER — HEPATITIS B VAC RECOMBINANT 10 MCG/ML IJ SUSP
1.0000 mL | Freq: Once | INTRAMUSCULAR | Status: DC
  Filled 2017-04-23: qty 1

## 2017-04-23 MED ORDER — LIDOCAINE HCL (PF) 1 % IJ SOLN
0.9000 mL | Freq: Once | INTRAMUSCULAR | Status: AC
  Administered 2017-04-23: 0.9 mL

## 2017-04-23 MED ORDER — METRONIDAZOLE 500 MG PO TABS
2000.0000 mg | ORAL_TABLET | Freq: Once | ORAL | Status: AC
  Administered 2017-04-23: 2000 mg via ORAL

## 2017-04-23 MED ORDER — PROMETHAZINE HCL 25 MG PO TABS
25.0000 mg | ORAL_TABLET | Freq: Four times a day (QID) | ORAL | Status: DC | PRN
  Administered 2017-04-23: 75 mg via ORAL

## 2017-04-23 MED ORDER — AZITHROMYCIN 250 MG PO TABS
1000.0000 mg | ORAL_TABLET | Freq: Once | ORAL | Status: AC
  Administered 2017-04-23: 1000 mg via ORAL

## 2017-04-23 MED ORDER — CEFTRIAXONE SODIUM 250 MG IJ SOLR
250.0000 mg | Freq: Once | INTRAMUSCULAR | Status: AC
  Administered 2017-04-23: 250 mg via INTRAMUSCULAR
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
## 2017-04-23 NOTE — Discharge Instructions (Signed)
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 more than 2 days or if it is severe and watery. This medicine can make you more sensitive to the sun. Keep out of the sun. If you cannot avoid being in the sun, wear protective clothing and use sunscreen. Do not use sun lamps or tanning beds/booths. What side effects may I notice from receiving this medicine? Side effects that you should report to your doctor or health care professional as soon as possible: -allergic reactions like skin rash, itching or hives, swelling of the face, lips, or tongue -confusion, nightmares or hallucinations -dark urine -difficulty breathing -hearing loss -irregular heartbeat or chest pain -pain or difficulty passing urine -redness, blistering, peeling or loosening of the skin, including inside the mouth -white patches or sores in the mouth -yellowing of the eyes  or skin Side effects that usually do not require medical attention (report to your doctor or health care professional if they continue or are bothersome): -diarrhea -dizziness, drowsiness -headache -stomach upset or vomiting -tooth discoloration -vaginal irritation This list may not describe all possible side effects. Call your doctor for medical advice about side effects. You may report side effects to FDA at 1-800-FDA-1088. Where should I keep my medicine? Keep out of the reach of children. Store at room temperature between 15 and 30 degrees C (59 and 86 degrees F). Throw away any unused medicine after the expiration date. NOTE: This sheet is a summary. It may not cover all possible information. If you have questions about this medicine, talk to your doctor, pharmacist, or health care provider.  2017 [employer] (2015-11-27 15:26:03)   Metronidazole (4 pills at once)-TAKE AFTER 48 HOURS AFTER ALCOHOL USE (SATURDAY).  CAN TAKE ALL FOUR TABS AT ONCE OR TWO TABS BEFORE AND TWO TABS AFTER A MEAL.   Also known as:  Flagyl   Metronidazole tablets or capsules What is this medicine? METRONIDAZOLE (me troe NI da zole) is an antiinfective. It is used to treat certain kinds of bacterial and protozoal infections. It will not work for colds, flu, or other  viral infections. This medicine may be used for other purposes; ask your health care provider or pharmacist if you have questions. COMMON BRAND NAME(S): Flagyl What should I tell my health care provider before I take this medicine? They need to know if you have any of these conditions: -anemia or other blood disorders -disease of the nervous system -fungal or yeast infection -if you drink alcohol containing drinks -liver disease -seizures -an unusual or allergic reaction to metronidazole, or other medicines, foods, dyes, or preservatives -pregnant or trying to get pregnant -breast-feeding How should I use this medicine? Take  this medicine by mouth with a full glass of water. Follow the directions on the prescription label. Take your medicine at regular intervals. Do not take your medicine more often than directed. Take all of your medicine as directed even if you think you are better. Do not skip doses or stop your medicine early. Talk to your pediatrician regarding the use of this medicine in children. Special care may be needed. Overdosage: If you think you have taken too much of this medicine contact a poison control center or emergency room at once. NOTE: This medicine is only for you. Do not share this medicine with others. What if I miss a dose? If you miss a dose, take it as soon as you can. If it is almost time for your next dose, take only that dose. Do not take double or extra doses. What may interact with this medicine? Do not take this medicine with any of the following medications: -alcohol or any product that contains alcohol -amprenavir oral solution -cisapride -disulfiram -dofetilide -dronedarone -paclitaxel injection -pimozide -ritonavir oral solution -sertraline oral solution -sulfamethoxazole-trimethoprim injection -thioridazine -ziprasidone This medicine may also interact with the following medications: -birth control pills -cimetidine -lithium -other medicines that prolong the QT interval (cause an abnormal heart rhythm) -phenobarbital -phenytoin -warfarin This list may not describe all possible interactions. Give your health care provider a list of all the medicines, herbs, non-prescription drugs, or dietary supplements you use. Also tell them if you smoke, drink alcohol, or use illegal drugs. Some items may interact with your medicine. What should I watch for while using this medicine? Tell your doctor or health care professional if your symptoms do not improve or if they get worse. You may get drowsy or dizzy. Do not drive, use machinery, or do anything that needs mental alertness  until you know how this medicine affects you. Do not stand or sit up quickly, especially if you are an older patient. This reduces the risk of dizzy or fainting spells. Avoid alcoholic drinks while you are taking this medicine and for three days afterward. Alcohol may make you feel dizzy, sick, or flushed. If you are being treated for a sexually transmitted disease, avoid sexual contact until you have finished your treatment. Your sexual partner may also need treatment. What side effects may I notice from receiving this medicine? Side effects that you should report to your doctor or health care professional as soon as possible: -allergic reactions like skin rash or hives, swelling of the face, lips, or tongue -confusion, clumsiness -difficulty speaking -discolored or sore mouth -dizziness -fever, infection -numbness, tingling, pain or weakness in the hands or feet -trouble passing urine or change in the amount of urine -redness, blistering, peeling or loosening of the skin, including inside the mouth -seizures -unusually weak or tired -vaginal irritation, dryness, or discharge Side effects that usually do not require medical attention (report to your doctor or  health care professional if they continue or are bothersome): -diarrhea -headache -irritability -metallic taste -nausea -stomach pain or cramps -trouble sleeping This list may not describe all possible side effects. Call your doctor for medical advice about side effects. You may report side effects to FDA at 1-800-FDA-1088. Where should I keep my medicine? Keep out of the reach of children. Store at room temperature below 25 degrees C (77 degrees F). Protect from light. Keep container tightly closed. Throw away any unused medicine after the expiration date. NOTE: This sheet is a summary. It may not cover all possible information. If you have questions about this medicine, talk to your doctor, pharmacist, or health care provider.   2017 [employer] Standard (2013-05-06 14:08:39)   Promethazine (pack of 3 for home use)-3 TABS SENT HOME WITH PATIENT WITH INSTRUCTIONS TO TAKE 1 TAB EVERY 6-8 HOURS IF NEEDED FOR NAUSEA/SLEEP. Also known as:  Phenergan  Promethazine tablets What is this medicine? PROMETHAZINE (proe METH a zeen) is an antihistamine. It is used to treat allergic reactions and to treat or prevent nausea and vomiting from illness or motion sickness. It is also used to make you sleep before surgery, and to help treat pain or nausea after surgery. This medicine may be used for other purposes; ask your health care provider or pharmacist if you have questions. COMMON BRAND NAME(S): Phenergan What should I tell my health care provider before I take this medicine? They need to know if you have any of these conditions: -glaucoma -high blood pressure or heart disease -kidney disease -liver disease -lung or breathing disease, like asthma -prostate trouble -pain or difficulty passing urine -seizures -an unusual or allergic reaction to promethazine or phenothiazines, other medicines, foods, dyes, or preservatives -pregnant or trying to get pregnant -breast-feeding How should I use this medicine? Take this medicine by mouth with a glass of water. Follow the directions on the prescription label. Take your doses at regular intervals. Do not take your medicine more often than directed. Talk to your pediatrician regarding the use of this medicine in children. Special care may be needed. This medicine should not be given to infants and children younger than 90 years old. Overdosage: If you think you have taken too much of this medicine contact a poison control center or emergency room at once. NOTE: This medicine is only for you. Do not share this medicine with others. What if I miss a dose? If you miss a dose, take it as soon as you can. If it is almost time for your next dose, take only that dose. Do not take double or  extra doses. What may interact with this medicine? Do not take this medicine with any of the following medications: -cisapride -dofetilide -dronedarone -MAOIs like Carbex, Eldepryl, Marplan, Nardil, Parnate -pimozide -quinidine, including dextromethorphan; quinidine -thioridazine -ziprasidone This medicine may also interact with the following medications: -certain medicines for depression, anxiety, or psychotic disturbances -certain medicines for anxiety or sleep -certain medicines for seizures like carbamazepine, phenobarbital, phenytoin -certain medicines for movement abnormalities as in Parkinson's disease, or for gastrointestinal problems -epinephrine -medicines for allergies or colds -muscle relaxants -narcotic medicines for pain -other medicines that prolong the QT interval (cause an abnormal heart rhythm) -tramadol -trimethobenzamide This list may not describe all possible interactions. Give your health care provider a list of all the medicines, herbs, non-prescription drugs, or dietary supplements you use. Also tell them if you smoke, drink alcohol, or use illegal drugs. Some items may interact with your medicine. What  should I watch for while using this medicine? Tell your doctor or health care professional if your symptoms do not start to get better in 1 to 2 days. You may get drowsy or dizzy. Do not drive, use machinery, or do anything that needs mental alertness until you know how this medicine affects you. To reduce the risk of dizzy or fainting spells, do not stand or sit up quickly, especially if you are an older patient. Alcohol may increase dizziness and drowsiness. Avoid alcoholic drinks. Your mouth may get dry. Chewing sugarless gum or sucking hard candy, and drinking plenty of water may help. Contact your doctor if the problem does not go away or is severe. This medicine may cause dry eyes and blurred vision. If you wear contact lenses you may feel some discomfort.  Lubricating drops may help. See your eye doctor if the problem does not go away or is severe. This medicine can make you more sensitive to the sun. Keep out of the sun. If you cannot avoid being in the sun, wear protective clothing and use sunscreen. Do not use sun lamps or tanning beds/booths. If you are diabetic, check your blood-sugar levels regularly. What side effects may I notice from receiving this medicine? Side effects that you should report to your doctor or health care professional as soon as possible: -blurred vision -irregular heartbeat, palpitations or chest pain -muscle or facial twitches -pain or difficulty passing urine -seizures -skin rash -slowed or shallow breathing -unusual bleeding or bruising -yellowing of the eyes or skin Side effects that usually do not require medical attention (report to your doctor or health care professional if they continue or are bothersome): -headache -nightmares, agitation, nervousness, excitability, not able to sleep (these are more likely in children) -stuffy nose This list may not describe all possible side effects. Call your doctor for medical advice about side effects. You may report side effects to FDA at 1-800-FDA-1088. Where should I keep my medicine? Keep out of the reach of children. Store at room temperature, between 20 and 25 degrees C (68 and 77 degrees F). Protect from light. Throw away any unused medicine after the expiration date. NOTE: This sheet is a summary. It may not cover all possible information. If you have questions about this medicine, talk to your doctor, pharmacist, or health care provider.  2017 [employer] Standard (2013-05-31 15:04:46)   Ceftriaxone (Injection/Shot)-GIVEN AT BEDSIDE Also known as:  Rocephin  Ceftriaxone injection What is this medicine? CEFTRIAXONE (sef try AX one) is a cephalosporin antibiotic. It is used to treat certain kinds of bacterial infections. It will not work for colds, flu,  or other viral infections. This medicine may be used for other purposes; ask your health care provider or pharmacist if you have questions. COMMON BRAND NAME(S): Rocephin What should I tell my health care provider before I take this medicine? They need to know if you have any of these conditions: -any chronic illness -bowel disease, like colitis -both kidney and liver disease -high bilirubin level in newborn patients -an unusual or allergic reaction to ceftriaxone, other cephalosporin or penicillin antibiotics, foods, dyes, or preservatives -pregnant or trying to get pregnant -breast-feeding How should I use this medicine? This medicine is injected into a muscle or infused it into a vein. It is usually given in a medical office or clinic. If you are to give this medicine you will be taught how to inject it. Follow instructions carefully. Use your doses at regular intervals. Do not take your  medicine more often than directed. Do not skip doses or stop your medicine early even if you feel better. Do not stop taking except on your doctor's advice. Talk to your pediatrician regarding the use of this medicine in children. Special care may be needed. Overdosage: If you think you have taken too much of this medicine contact a poison control center or emergency room at once. NOTE: This medicine is only for you. Do not share this medicine with others. What if I miss a dose? If you miss a dose, take it as soon as you can. If it is almost time for your next dose, take only that dose. Do not take double or extra doses. What may interact with this medicine? Do not take this medicine with any of the following medications: -intravenous calcium This medicine may also interact with the following medications: -birth control pills This list may not describe all possible interactions. Give your health care provider a list of all the medicines, herbs, non-prescription drugs, or dietary supplements you use. Also  tell them if you smoke, drink alcohol, or use illegal drugs. Some items may interact with your medicine. What should I watch for while using this medicine? Tell your doctor or health care professional if your symptoms do not improve or if they get worse. Do not treat diarrhea with over the counter products. Contact your doctor if you have diarrhea that lasts more than 2 days or if it is severe and watery. If you are being treated for a sexually transmitted disease, avoid sexual contact until you have finished your treatment. Having sex can infect your sexual partner. Calcium may bind to this medicine and cause lung or kidney problems. Avoid calcium products while taking this medicine and for 48 hours after taking the last dose of this medicine. What side effects may I notice from receiving this medicine? Side effects that you should report to your doctor or health care professional as soon as possible: -allergic reactions like skin rash, itching or hives, swelling of the face, lips, or tongue -breathing problems -fever, chills -irregular heartbeat -pain when passing urine -seizures -stomach pain, cramps -unusual bleeding, bruising -unusually weak or tired Side effects that usually do not require medical attention (report to your doctor or health care professional if they continue or are bothersome): -diarrhea -dizzy, drowsy -headache -nausea, vomiting -pain, swelling, irritation where injected -stomach upset -sweating This list may not describe all possible side effects. Call your doctor for medical advice about side effects. You may report side effects to FDA at 1-800-FDA-1088. Where should I keep my medicine? Keep out of the reach of children. Store at room temperature below 25 degrees C (77 degrees F). Protect from light. Throw away any unused vials after the expiration date. NOTE: This sheet is a summary. It may not cover all possible information. If you have questions about this  medicine, talk to your doctor, pharmacist, or health care provider.  2017 [employer] Standard (2014-04-17 09:14:54)
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
## 2017-04-23 NOTE — ED Notes (Signed)
Patient d/c'd from room 8 by the SANE RN

## 2017-04-23 NOTE — ED Notes (Signed)
Spoke with SANE on call and she will be calling poss. Mardella LaymanLindsey to come and see Pt. About SANE needs.
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
## 2017-04-23 NOTE — ED Notes (Signed)
SANE RN Mardella LaymanLindsey is here speaking with the pt.

## 2017-04-23 NOTE — ED Notes (Signed)
RN in to speak with pt and let her know that Mardella LaymanLindsey the SANE RN will be here to see her. Offered Pt. Anything she may need while waiting.

## 2017-07-25 ENCOUNTER — Emergency Department (HOSPITAL_BASED_OUTPATIENT_CLINIC_OR_DEPARTMENT_OTHER)
Admission: EM | Admit: 2017-07-25 | Discharge: 2017-07-25 | Disposition: A | Discharge status: Home / Self Care | Payer: 59 | Attending: Emergency Medicine | Admitting: Emergency Medicine

## 2017-07-25 ENCOUNTER — Encounter (HOSPITAL_BASED_OUTPATIENT_CLINIC_OR_DEPARTMENT_OTHER): Payer: Self-pay | Admitting: Emergency Medicine

## 2017-07-25 DIAGNOSIS — F329 Major depressive disorder, single episode, unspecified: Secondary | ICD-10-CM | POA: Insufficient documentation

## 2017-07-25 DIAGNOSIS — Z9049 Acquired absence of other specified parts of digestive tract: Secondary | ICD-10-CM | POA: Diagnosis not present

## 2017-07-25 DIAGNOSIS — F17210 Nicotine dependence, cigarettes, uncomplicated: Secondary | ICD-10-CM | POA: Diagnosis not present

## 2017-07-25 DIAGNOSIS — Z79899 Other long term (current) drug therapy: Secondary | ICD-10-CM | POA: Insufficient documentation

## 2017-07-25 DIAGNOSIS — I10 Essential (primary) hypertension: Secondary | ICD-10-CM | POA: Diagnosis not present

## 2017-07-25 DIAGNOSIS — H538 Other visual disturbances: Secondary | ICD-10-CM | POA: Diagnosis present

## 2017-07-25 DIAGNOSIS — F419 Anxiety disorder, unspecified: Secondary | ICD-10-CM | POA: Diagnosis not present

## 2017-07-25 HISTORY — DX: Essential (primary) hypertension: I10

## 2017-07-25 LAB — CBG MONITORING, ED: GLUCOSE-CAPILLARY: 81 mg/dL (ref 65–99)

## 2017-07-25 MED ORDER — TETRACAINE HCL 0.5 % OP SOLN
2.0000 [drp] | Freq: Once | OPHTHALMIC | Status: AC
  Administered 2017-07-25: 2 [drp] via OPHTHALMIC
  Filled 2017-07-25: qty 4

## 2017-07-25 MED ORDER — FLUORESCEIN SODIUM 0.6 MG OP STRP
ORAL_STRIP | OPHTHALMIC | Status: AC
  Filled 2017-07-25: qty 1

## 2017-07-25 MED ORDER — FLUORESCEIN SODIUM 1 MG OP STRP
1.0000 | ORAL_STRIP | Freq: Once | OPHTHALMIC | Status: AC
  Administered 2017-07-25: 1 via OPHTHALMIC
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
## 2017-07-25 NOTE — ED Triage Notes (Signed)
Patient states that she has had blurred vision x 1 week. Reports that she called her MD yesterday and was told to come here. Thursday she was having black in her eyes

## 2017-12-24 ENCOUNTER — Ambulatory Visit
Admission: RE | Admit: 2017-12-24 | Discharge: 2017-12-24 | Disposition: A | Discharge status: Home / Self Care | Payer: BLUE CROSS/BLUE SHIELD | Source: Ambulatory Visit | Attending: Family Medicine | Admitting: Family Medicine

## 2017-12-24 ENCOUNTER — Other Ambulatory Visit: Payer: Self-pay | Admitting: Family Medicine

## 2017-12-24 DIAGNOSIS — W19XXXA Unspecified fall, initial encounter: Secondary | ICD-10-CM

## 2018-04-27 ENCOUNTER — Ambulatory Visit (INDEPENDENT_AMBULATORY_CARE_PROVIDER_SITE_OTHER): Payer: BLUE CROSS/BLUE SHIELD | Admitting: Orthopaedic Surgery

## 2018-04-27 ENCOUNTER — Encounter (INDEPENDENT_AMBULATORY_CARE_PROVIDER_SITE_OTHER): Payer: Self-pay | Admitting: Orthopaedic Surgery

## 2018-04-27 DIAGNOSIS — M79642 Pain in left hand: Secondary | ICD-10-CM | POA: Diagnosis not present

## 2018-04-27 DIAGNOSIS — R200 Anesthesia of skin: Secondary | ICD-10-CM

## 2018-04-27 DIAGNOSIS — M79641 Pain in right hand: Secondary | ICD-10-CM

## 2018-04-27 MED ORDER — GABAPENTIN 100 MG PO CAPS: 100.0000 mg | ORAL_CAPSULE | Freq: Three times a day (TID) | ORAL | 3 refills | Status: DC

## 2018-04-27 NOTE — Progress Notes (Signed)
Office Visit Note   Patient: Nicole [name]           Date of Birth: 10/03/1981           MRN: [id number] Visit Date: 04/27/2018              Requested by: Wilfrid Lund, PA [address] [place], [place] [address] PCP: Wilfrid Lund, [place]   Assessment & Plan: Visit Diagnoses:  1. Bilateral hand numbness     Plan: I am going to start Neurontin 100 mg 3 times a day to see how this will help with her neurologic symptoms.  She is interested in obtaining nerve conduction studies as well to see if there is a mechanical source of her numbness and tingling as opposed to a physiologic source.  We will see her back once the studies are performed.  Follow-Up Instructions: She will follow-up once the nerve studies have been performed on her bilateral upper extremities.  Orders:   No orders of the defined types were placed in this encounter.     Procedures: No procedures performed   Clinical Data: No additional findings.   Subjective: Chief Complaint  Patient presents with  . Right Hand - Pain  . Left Hand - Pain  The patient is a 37 year old female who comes in with a history of bilateral hand stiffness as well as numbness that is global in both her hands.  She is a smoker but not a diabetic.  It does not wake her up at night.  Occurs during the day and can occur all day long.  She does a lot of typing as well.  She denies any neck pain.  She denies any numbness tingling in her feet.  She has not had any chemotherapy.  This does not appear to be positional when she describes it.  However when she is on a long car trips she does get intermittent numbness in both her hands.  Slowly worsening for her and she is worried about it.  She does get B12 injections due to a deficiency.  HPI  Review of Systems She currently denies any headache, chest pain, shortness of breath, fever, chills, nausea, vomiting.   Objective: Vital Signs: There were no vitals taken for this  visit.  Physical Exam She is alert and oriented x3 and in no acute distress Ortho Exam Examination of her bilateral upper extremities shows no muscle atrophy in the arms or hands.  She has negative Tinel sign at the transverse carpal ligament or the cubital tunnel at either side.  She has negative Spurling's exam bilaterally.  Basically nothing stands on her exam today. Specialty Comments:  No specialty comments available.  Imaging: No results found.   PMFS History: Patient Active Problem List   Diagnosis Date Noted  . Elevated liver enzymes 11/10/2012  . IBS (irritable bowel syndrome) 11/10/2012  . Influenza 10/27/2012  . Routine general medical examination at a health care facility 07/30/2012  . Community acquired pneumonia 03/15/2012  . B12 deficiency 01/08/2012  . Depression   . GERD (gastroesophageal reflux disease)   . Anxiety   . Symptomatic cholelithiasis 11/11/2011   Past Medical History:  Diagnosis Date  . Anxiety   . Depression   . GERD (gastroesophageal reflux disease)   . Herpes   . Hypertension   . IBS (irritable bowel syndrome)   . Urinary tract infection    hx of UTIs    Family History  Problem  Relation Age of Onset  . Hypertension Father   . Stroke Father   . Cancer Maternal Uncle        esophageal  . Hypertension Maternal Grandmother   . Anesthesia problems Neg Hx   . Hypotension Neg Hx   . Malignant hyperthermia Neg Hx   . Pseudochol deficiency Neg Hx     Past Surgical History:  Procedure Laterality Date  . CHOLECYSTECTOMY  12/05/2011   Procedure: LAPAROSCOPIC CHOLECYSTECTOMY;  Surgeon: Shelly Rubensteinouglas A Kimala Horne, MD;  Location: [facility] OR;  Service: General;  Laterality: N/A;  . COLONOSCOPY     Social History   Occupational History  . Not on file  Tobacco Use  . Smoking status: Current Every Day Smoker    Packs/day: 1.00    Years: 10.00    Pack years: 10.00    Types: Cigarettes  . Smokeless tobacco: Never Used  . Tobacco comment: Quit 06/07/12.   using patches  Substance and Sexual Activity  . Alcohol use: Yes    Comment: daily- "really drunk yesterday"- 12/04/2011  . Drug use: No  . Sexual activity: Yes    Birth control/protection: Pill

## 2018-04-27 NOTE — Addendum Note (Signed)
Addended by: Henrine ScrewsJACKSON, Reema Chick L on: 04/27/2018 11:27 AM   Modules accepted: Orders

## 2018-05-11 ENCOUNTER — Ambulatory Visit (INDEPENDENT_AMBULATORY_CARE_PROVIDER_SITE_OTHER): Payer: BLUE CROSS/BLUE SHIELD | Admitting: Physical Medicine and Rehabilitation

## 2018-05-11 ENCOUNTER — Encounter

## 2018-05-11 DIAGNOSIS — R202 Paresthesia of skin: Secondary | ICD-10-CM

## 2018-05-11 NOTE — Progress Notes (Addendum)
   Numeric Pain Rating Scale and Functional Assessment Average Pain 0   In the last MONTH (on 0-10 scale) has pain interfered with the following?  1. General activity like being  able to carry out your everyday physical activities such as walking, climbing stairs, carrying groceries, or0 moving a chair?  Rating(0)

## 2018-05-11 NOTE — Progress Notes (Signed)
Nicole [name] - 38 y.o. female MRN [id number]  Date of birth: [birth year]  Office Visit Note: Visit Date: 05/11/2018 PCP: Wilfrid Lund, PA Referred by: Wilfrid Lund, PA  Subjective: Chief Complaint  Patient presents with  . Right Hand - Numbness  . Left Hand - Numbness   HPI: Nicole [name] is a 37 year old right-hand-dominant female that comes in today at the request of [name] for electrodiagnostic study of both upper limbs.  She reports worsening numbness and tingling in both hands which is nondermatomal and occur in all fingers equally left and right.  She has places on her back which are basically in the thoracic area of her back where she has lack of sensation when somebody tries to scratch her back or she tries to scratch her back there is no sensation at all.  She has a history of back spasms and stiffness and swelling.  She gets stiffness and swelling that really is more annoying than painful.  She denies any frank radicular symptoms or radicular pain down the arms.  She has no prior history of electrodiagnostic studies or spine surgery.  She does have a history of depression and anxiety and currently takes Lamictal as well as Adderall and clonazepam.  She does use meloxicam.   ROS Otherwise per HPI.  Assessment & Plan: Visit Diagnoses:  1. Paresthesia of skin     Plan: No additional findings.  Impression: Essentially NORMAL electrodiagnostic study of both upper limbs.  There is no significant electrodiagnostic evidence of focal nerve compression, brachial plexopathy, cervical radiculopathy or general peripheral polyneuropathy..    As you know, purely sensory or demyelinating radiculopathies and chemical radiculitis may not be detected with this particular electrodiagnostic study.  This electrodiagnostic study cannot rule out small fiber polyneuropathy and dysesthesias from central pain sensitization syndromes such as fibromyalgia.  Myotomal referral pain from  trigger points is also not excluded.   Recommendations: 1.  Follow-up with referring physician. 2.  Continue current management of symptoms.    Meds & Orders: No orders of the defined types were placed in this encounter.   Orders Placed This Encounter  Procedures  . NCV with EMG (electromyography)    Follow-up: Return for [name], M.D..   Procedures: No procedures performed  EMG & NCV Findings: All nerve conduction studies (as indicated in the following tables) were within normal limits.  Left vs. Right side comparison data for the ulnar motor nerve indicates abnormal L-R velocity difference (A Elbow-B Elbow, 31 m/s).  All remaining left vs. right side differences were within normal limits.    All examined muscles (as indicated in the following table) showed no evidence of electrical instability.    Impression: Essentially NORMAL electrodiagnostic study of both upper limbs.  There is no significant electrodiagnostic evidence of focal nerve compression, brachial plexopathy, cervical radiculopathy or general peripheral polyneuropathy..    As you know, purely sensory or demyelinating radiculopathies and chemical radiculitis may not be detected with this particular electrodiagnostic study.  This electrodiagnostic study cannot rule out small fiber polyneuropathy and dysesthesias from central pain sensitization syndromes such as fibromyalgia.  Myotomal referral pain from trigger points is also not excluded.   Recommendations: 1.  Follow-up with referring physician. 2.  Continue current management of symptoms.  ___________________________ Nicole [name] Board Certified, American Board of Physical Medicine and Rehabilitation    Nerve Conduction Studies Anti Sensory Summary Table   Stim Site NR Peak (ms) Norm Peak (ms) P-T Amp (  V) Norm P-T Amp Site1 Site2 Delta-P (ms) Dist (cm) Vel (m/s) Norm Vel (m/s)  Left Median Acr Palm Anti Sensory (2nd Digit)  32C  Wrist     3.1 <3.6 58.6 >10 Wrist Palm 1.4 0.0    Palm    1.7 <2.0 72.2         Right Median Acr Palm Anti Sensory (2nd Digit)  32.6C  Wrist    3.0 <3.6 49.9 >10 Wrist Palm 1.3 0.0    Palm    1.7 <2.0 42.5         Left Radial Anti Sensory (Base 1st Digit)  31.5C  Wrist    2.3 <3.1 31.5  Wrist Base 1st Digit 2.3 0.0    Right Radial Anti Sensory (Base 1st Digit)  32C  Wrist    2.2 <3.1 41.3  Wrist Base 1st Digit 2.2 0.0    Left Ulnar Anti Sensory (5th Digit)  32C  Wrist    3.2 <3.7 22.7 >15.0 Wrist 5th Digit 3.2 14.0 44 >38  Right Ulnar Anti Sensory (5th Digit)  32.5C  Wrist    3.0 <3.7 28.0 >15.0 Wrist 5th Digit 3.0 14.0 47 >38   Motor Summary Table   Stim Site NR Onset (ms) Norm Onset (ms) O-P Amp (mV) Norm O-P Amp Site1 Site2 Delta-0 (ms) Dist (cm) Vel (m/s) Norm Vel (m/s)  Left Median Motor (Abd Poll Brev)  31.5C  Wrist    3.3 <4.2 6.5 >5 Elbow Wrist 3.7 19.5 53 >50  Elbow    7.0  6.8         Right Median Motor (Abd Poll Brev)  31.9C  Wrist    3.1 <4.2 5.1 >5 Elbow Wrist 4.0 20.0 50 >50  Elbow    7.1  4.9         Left Ulnar Motor (Abd Dig Min)  31.5C  Wrist    2.8 <4.2 8.9 >3 B Elbow Wrist 3.6 19.0 53 >53  B Elbow    6.4  9.5  A Elbow B Elbow 1.5 9.0 60 >53  A Elbow    7.9  9.0         Right Ulnar Motor (Abd Dig Min)  31.7C  Wrist    2.9 <4.2 10.2 >3 B Elbow Wrist 3.3 20.0 61 >53  B Elbow    6.2  10.7  A Elbow B Elbow 1.1 10.0 91 >53  A Elbow    7.3  10.6          EMG   Side Muscle Nerve Root Ins Act Fibs Psw Amp Dur Poly Recrt Int Dennie Bible Comment  Right Abd Poll Brev Median C8-T1 Nml Nml Nml Nml Nml 0 Nml Nml   Right 1stDorInt Ulnar C8-T1 Nml Nml Nml Nml Nml 0 Nml Nml   Right PronatorTeres Median C6-7 Nml Nml Nml Nml Nml 0 Nml Nml   Right Biceps Musculocut C5-6 Nml Nml Nml Nml Nml 0 Nml Nml   Right Deltoid Axillary C5-6 Nml Nml Nml Nml Nml 0 Nml Nml     Nerve Conduction Studies Anti Sensory Left/Right Comparison   Stim Site L Lat (ms) R Lat (ms) L-R Lat (ms) L Amp (V) R  Amp (V) L-R Amp (%) Site1 Site2 L Vel (m/s) R Vel (m/s) L-R Vel (m/s)  Median Acr Palm Anti Sensory (2nd Digit)  32C  Wrist 3.1 3.0 0.1 58.6 49.9 14.8 Wrist Palm     Palm 1.7 1.7 0.0 72.2 42.5 41.1  Radial Anti Sensory (Base 1st Digit)  31.5C  Wrist 2.3 2.2 0.1 31.5 41.3 23.7 Wrist Base 1st Digit     Ulnar Anti Sensory (5th Digit)  32C  Wrist 3.2 3.0 0.2 22.7 28.0 18.9 Wrist 5th Digit 44 47 3   Motor Left/Right Comparison   Stim Site L Lat (ms) R Lat (ms) L-R Lat (ms) L Amp (mV) R Amp (mV) L-R Amp (%) Site1 Site2 L Vel (m/s) R Vel (m/s) L-R Vel (m/s)  Median Motor (Abd Poll Brev)  31.5C  Wrist 3.3 3.1 0.2 6.5 5.1 21.5 Elbow Wrist 53 50 3  Elbow 7.0 7.1 0.1 6.8 4.9 27.9       Ulnar Motor (Abd Dig Min)  31.5C  Wrist 2.8 2.9 0.1 8.9 10.2 12.7 B Elbow Wrist 53 61 8  B Elbow 6.4 6.2 0.2 9.5 10.7 11.2 A Elbow B Elbow 60 91 *31  A Elbow 7.9 7.3 0.6 9.0 10.6 15.1          Waveforms:                     Clinical History: No specialty comments available.   She reports that she has been smoking cigarettes.  She has a 10.00 pack-year smoking history. She has never used smokeless tobacco. No results for input(s): HGBA1C, LABURIC in the last 8760 hours.  Objective:  VS:  HT:    WT:   BMI:     BP:   HR: bpm  TEMP: ( )  RESP:  Physical Exam  Constitutional: She is oriented to person, place, and time.  Musculoskeletal:  Inspection reveals no atrophy of the bilateral APB or FDI or hand intrinsics. There is no swelling, color changes, allodynia or dystrophic changes.  There is a negative Phalen's test bilaterally. There is a negative Hoffmann's test bilaterally.  Neurological: She is alert and oriented to person, place, and time. She exhibits normal muscle tone. Coordination normal.    Ortho Exam Imaging: No results found.  Past Medical/Family/Surgical/Social History: Medications & Allergies reviewed per EMR, new medications updated. Patient Active Problem List    Diagnosis Date Noted  . Elevated liver enzymes 11/10/2012  . IBS (irritable bowel syndrome) 11/10/2012  . Influenza 10/27/2012  . Routine general medical examination at a health care facility 07/30/2012  . Community acquired pneumonia 03/15/2012  . B12 deficiency 01/08/2012  . Depression   . GERD (gastroesophageal reflux disease)   . Anxiety   . Symptomatic cholelithiasis 11/11/2011   Past Medical History:  Diagnosis Date  . Anxiety   . Depression   . GERD (gastroesophageal reflux disease)   . Herpes   . Hypertension   . IBS (irritable bowel syndrome)   . Urinary tract infection    hx of UTIs   Family History  Problem Relation Age of Onset  . Hypertension Father   . Stroke Father   . Cancer Maternal Uncle        esophageal  . Hypertension Maternal Grandmother   . Anesthesia problems Neg Hx   . Hypotension Neg Hx   . Malignant hyperthermia Neg Hx   . Pseudochol deficiency Neg Hx    Past Surgical History:  Procedure Laterality Date  . CHOLECYSTECTOMY  12/05/2011   Procedure: LAPAROSCOPIC CHOLECYSTECTOMY;  Surgeon: Shelly Rubenstein, MD;  Location: [facility] OR;  Service: General;  Laterality: N/A;  . COLONOSCOPY     Social History   Occupational History  . Not on file  Tobacco Use  .  Smoking status: Current Every Day Smoker    Packs/day: 1.00    Years: 10.00    Pack years: 10.00    Types: Cigarettes  . Smokeless tobacco: Never Used  . Tobacco comment: Quit 06/07/12.  using patches  Substance and Sexual Activity  . Alcohol use: Yes    Comment: daily- "really drunk yesterday"- 12/04/2011  . Drug use: No  . Sexual activity: Yes    Birth control/protection: Pill

## 2018-05-12 ENCOUNTER — Encounter (INDEPENDENT_AMBULATORY_CARE_PROVIDER_SITE_OTHER): Payer: Self-pay | Admitting: Orthopaedic Surgery

## 2018-05-12 ENCOUNTER — Ambulatory Visit (INDEPENDENT_AMBULATORY_CARE_PROVIDER_SITE_OTHER): Payer: BLUE CROSS/BLUE SHIELD | Admitting: Orthopaedic Surgery

## 2018-05-12 DIAGNOSIS — R200 Anesthesia of skin: Secondary | ICD-10-CM | POA: Diagnosis not present

## 2018-05-12 MED ORDER — TIZANIDINE HCL 4 MG PO TABS: 4.0000 mg | ORAL_TABLET | Freq: Three times a day (TID) | ORAL | 0 refills | Status: DC | PRN

## 2018-05-12 NOTE — Procedures (Signed)
EMG & NCV Findings: All nerve conduction studies (as indicated in the following tables) were within normal limits.  Left vs. Right side comparison data for the ulnar motor nerve indicates abnormal L-R velocity difference (A Elbow-B Elbow, 31 m/s).  All remaining left vs. right side differences were within normal limits.    All examined muscles (as indicated in the following table) showed no evidence of electrical instability.    Impression: Essentially NORMAL electrodiagnostic study of both upper limbs.  There is no significant electrodiagnostic evidence of focal nerve compression, brachial plexopathy, cervical radiculopathy or general peripheral polyneuropathy..    As you know, purely sensory or demyelinating radiculopathies and chemical radiculitis may not be detected with this particular electrodiagnostic study.  This electrodiagnostic study cannot rule out small fiber polyneuropathy and dysesthesias from central pain sensitization syndromes such as fibromyalgia.  Myotomal referral pain from trigger points is also not excluded.   Recommendations: 1.  Follow-up with referring physician. 2.  Continue current management of symptoms.  ___________________________ Nicole PlummerFred Alexis [name] FAAPMR Board Certified, American Board of Physical Medicine and Rehabilitation    Nerve Conduction Studies Anti Sensory Summary Table   Stim Site NR Peak (ms) Norm Peak (ms) P-T Amp (V) Norm P-T Amp Site1 Site2 Delta-P (ms) Dist (cm) Vel (m/s) Norm Vel (m/s)  Left Median Acr Palm Anti Sensory (2nd Digit)  32C  Wrist    3.1 <3.6 58.6 >10 Wrist Palm 1.4 0.0    Palm    1.7 <2.0 72.2         Right Median Acr Palm Anti Sensory (2nd Digit)  32.6C  Wrist    3.0 <3.6 49.9 >10 Wrist Palm 1.3 0.0    Palm    1.7 <2.0 42.5         Left Radial Anti Sensory (Base 1st Digit)  31.5C  Wrist    2.3 <3.1 31.5  Wrist Base 1st Digit 2.3 0.0    Right Radial Anti Sensory (Base 1st Digit)  32C  Wrist    2.2 <3.1 41.3  Wrist Base  1st Digit 2.2 0.0    Left Ulnar Anti Sensory (5th Digit)  32C  Wrist    3.2 <3.7 22.7 >15.0 Wrist 5th Digit 3.2 14.0 44 >38  Right Ulnar Anti Sensory (5th Digit)  32.5C  Wrist    3.0 <3.7 28.0 >15.0 Wrist 5th Digit 3.0 14.0 47 >38   Motor Summary Table   Stim Site NR Onset (ms) Norm Onset (ms) O-P Amp (mV) Norm O-P Amp Site1 Site2 Delta-0 (ms) Dist (cm) Vel (m/s) Norm Vel (m/s)  Left Median Motor (Abd Poll Brev)  31.5C  Wrist    3.3 <4.2 6.5 >5 Elbow Wrist 3.7 19.5 53 >50  Elbow    7.0  6.8         Right Median Motor (Abd Poll Brev)  31.9C  Wrist    3.1 <4.2 5.1 >5 Elbow Wrist 4.0 20.0 50 >50  Elbow    7.1  4.9         Left Ulnar Motor (Abd Dig Min)  31.5C  Wrist    2.8 <4.2 8.9 >3 B Elbow Wrist 3.6 19.0 53 >53  B Elbow    6.4  9.5  A Elbow B Elbow 1.5 9.0 60 >53  A Elbow    7.9  9.0         Right Ulnar Motor (Abd Dig Min)  31.7C  Wrist    2.9 <4.2 10.2 >3 B Elbow Wrist 3.3  20.0 61 >53  B Elbow    6.2  10.7  A Elbow B Elbow 1.1 10.0 91 >53  A Elbow    7.3  10.6          EMG   Side Muscle Nerve Root Ins Act Fibs Psw Amp Dur Poly Recrt Int Dennie Bible Comment  Right Abd Poll Brev Median C8-T1 Nml Nml Nml Nml Nml 0 Nml Nml   Right 1stDorInt Ulnar C8-T1 Nml Nml Nml Nml Nml 0 Nml Nml   Right PronatorTeres Median C6-7 Nml Nml Nml Nml Nml 0 Nml Nml   Right Biceps Musculocut C5-6 Nml Nml Nml Nml Nml 0 Nml Nml   Right Deltoid Axillary C5-6 Nml Nml Nml Nml Nml 0 Nml Nml     Nerve Conduction Studies Anti Sensory Left/Right Comparison   Stim Site L Lat (ms) R Lat (ms) L-R Lat (ms) L Amp (V) R Amp (V) L-R Amp (%) Site1 Site2 L Vel (m/s) R Vel (m/s) L-R Vel (m/s)  Median Acr Palm Anti Sensory (2nd Digit)  32C  Wrist 3.1 3.0 0.1 58.6 49.9 14.8 Wrist Palm     Palm 1.7 1.7 0.0 72.2 42.5 41.1       Radial Anti Sensory (Base 1st Digit)  31.5C  Wrist 2.3 2.2 0.1 31.5 41.3 23.7 Wrist Base 1st Digit     Ulnar Anti Sensory (5th Digit)  32C  Wrist 3.2 3.0 0.2 22.7 28.0 18.9 Wrist 5th Digit 44  47 3   Motor Left/Right Comparison   Stim Site L Lat (ms) R Lat (ms) L-R Lat (ms) L Amp (mV) R Amp (mV) L-R Amp (%) Site1 Site2 L Vel (m/s) R Vel (m/s) L-R Vel (m/s)  Median Motor (Abd Poll Brev)  31.5C  Wrist 3.3 3.1 0.2 6.5 5.1 21.5 Elbow Wrist 53 50 3  Elbow 7.0 7.1 0.1 6.8 4.9 27.9       Ulnar Motor (Abd Dig Min)  31.5C  Wrist 2.8 2.9 0.1 8.9 10.2 12.7 B Elbow Wrist 53 61 8  B Elbow 6.4 6.2 0.2 9.5 10.7 11.2 A Elbow B Elbow 60 91 *31  A Elbow 7.9 7.3 0.6 9.0 10.6 15.1          Waveforms:

## 2018-05-12 NOTE — Progress Notes (Signed)
The patient is here for follow-up after having bilateral upper extremity nerve conduction studies due to peripheral neuropathy.  She understands the studies were read as normal.  I did talk to Dr. Alvester MorinNewton about him and the studies were normal.  She has had definitely some vague aches and complaints around her body.  I gave her reassurance that the studies did not show anything out of the ordinary.  On exam she has normal upper body strength today.  Her hands are slightly numb she states but there is no gross deformities that I can see.  At this point she has not started the Neurontin that I prescribed for her because she is out of town.  I do feel that she would benefit from getting on Neurontin as well as an anti-inflammatory and vitamin B6.  I will try a short-term muscle  Relaxer.  I will see her back in 4 weeks to see how she is doing overall.  All question concerns were answered and addressed.

## 2018-05-14 ENCOUNTER — Encounter (INDEPENDENT_AMBULATORY_CARE_PROVIDER_SITE_OTHER): Payer: Self-pay | Admitting: Physical Medicine and Rehabilitation

## 2018-08-20 DIAGNOSIS — Z0000 Encounter for general adult medical examination without abnormal findings: Secondary | ICD-10-CM | POA: Diagnosis not present

## 2018-08-20 DIAGNOSIS — E538 Deficiency of other specified B group vitamins: Secondary | ICD-10-CM | POA: Diagnosis not present

## 2018-08-20 DIAGNOSIS — I10 Essential (primary) hypertension: Secondary | ICD-10-CM | POA: Diagnosis not present

## 2018-08-20 DIAGNOSIS — E7800 Pure hypercholesterolemia, unspecified: Secondary | ICD-10-CM | POA: Diagnosis not present

## 2018-08-30 DIAGNOSIS — E538 Deficiency of other specified B group vitamins: Secondary | ICD-10-CM | POA: Diagnosis not present

## 2018-08-30 DIAGNOSIS — E7800 Pure hypercholesterolemia, unspecified: Secondary | ICD-10-CM | POA: Diagnosis not present

## 2018-09-24 DIAGNOSIS — I10 Essential (primary) hypertension: Secondary | ICD-10-CM | POA: Diagnosis not present

## 2018-09-24 DIAGNOSIS — E538 Deficiency of other specified B group vitamins: Secondary | ICD-10-CM | POA: Diagnosis not present

## 2018-11-01 DIAGNOSIS — M542 Cervicalgia: Secondary | ICD-10-CM | POA: Diagnosis not present

## 2018-11-03 DIAGNOSIS — R112 Nausea with vomiting, unspecified: Secondary | ICD-10-CM | POA: Diagnosis not present

## 2018-11-03 DIAGNOSIS — S0093XA Contusion of unspecified part of head, initial encounter: Secondary | ICD-10-CM | POA: Diagnosis not present

## 2018-11-03 DIAGNOSIS — S20219A Contusion of unspecified front wall of thorax, initial encounter: Secondary | ICD-10-CM | POA: Diagnosis not present

## 2019-08-12 ENCOUNTER — Ambulatory Visit (INDEPENDENT_AMBULATORY_CARE_PROVIDER_SITE_OTHER): Payer: 59 | Admitting: Cardiology

## 2019-08-12 ENCOUNTER — Encounter: Payer: Self-pay | Admitting: Cardiology

## 2019-08-12 ENCOUNTER — Other Ambulatory Visit: Payer: Self-pay

## 2019-08-12 VITALS — BP 142/95 | HR 75 | Temp 97.1°F | Ht 63.0 in | Wt 150.0 lb

## 2019-08-12 DIAGNOSIS — Z720 Tobacco use: Secondary | ICD-10-CM

## 2019-08-12 DIAGNOSIS — R55 Syncope and collapse: Secondary | ICD-10-CM

## 2019-08-12 DIAGNOSIS — R002 Palpitations: Secondary | ICD-10-CM

## 2019-08-12 NOTE — Patient Instructions (Signed)
Medication Instructions:  Continue same medications   Lab Work: None ordered   Testing/Procedures: Schedule Echo Schedule 14 day ZIO Monitor  Follow-Up: At [facility], you and your health needs are our priority.  As part of our continuing mission to provide you with exceptional heart care, we have created designated Provider Care Teams.  These Care Teams include your primary Cardiologist (physician) and Advanced Practice Providers (APPs -  Physician Assistants and Nurse Practitioners) who all work together to provide you with the care you need, when you need it.  Your next appointment:  2 months   The format for your next appointment:  Office   Provider:  Dr.Schumann

## 2019-08-12 NOTE — Progress Notes (Signed)
Cardiology Office Note:    Date:  08/12/2019   ID:  ZABDI MIS, DOB 10/25/1980, MRN [id number]  PCP:  Wilfrid Lund, PA  Cardiologist:  No primary care provider on file.  Electrophysiologist:  None   Referring MD: Wilfrid Lund, PA   Chief Complaint  Patient presents with  . Loss of Consciousness    History of Present Illness:    Nicole [name] is a 38 y.o. female with a hx of hypertension, GERD, IBS, anxiety who is referred by Horton Marshall, PA for an evaluation of near syncope.  Reports that she has had episodes for years of feeling like he was going to pass out.  Often triggered by being in the heat.  Reports since January last year has had 3 episodes where she felt like she was going to pass out.  These episodes have been triggered by abdominal pain.  She states that she would get severe abdominal pain, and feels short of breath and lightheaded.  Felt like she was going to pass out but no actual syncopal episodes.  She will lie down and symptoms will resolve.  She denies any chest pain.  She was seen in [facility] on 07/20/2019 following 1 of these episodes.  Work-up was unremarkable, felt she likely had vasovagal syncope.  She reports she does have episodes of palpitations but not associated with near syncopal episodes.  States that she gets palpitations at least once per month, usally resolves after few seconds but will sometimes have recurrent episodes.  Father had CVA in 66s, valve replacement in 50s.  Smoked cigarettes 1ppd for 20 years but quit in February.  Smoking cigars now, one per day.  Drinks mixed drinks or wine, 1-2 per day.    Past Medical History:  Diagnosis Date  . Anxiety   . Depression   . GERD (gastroesophageal reflux disease)   . Herpes   . Hypertension   . IBS (irritable bowel syndrome)   . Urinary tract infection    hx of UTIs    Past Surgical History:  Procedure Laterality Date  . CHOLECYSTECTOMY  12/05/2011   Procedure: LAPAROSCOPIC  CHOLECYSTECTOMY;  Surgeon: Shelly Rubenstein, MD;  Location: [facility] OR;  Service: General;  Laterality: N/A;  . COLONOSCOPY      Current Medications: Current Meds  Medication Sig  . Amphetamine-Dextroamphetamine (ADDERALL PO) Take by mouth.  . Celecoxib (CELEBREX PO) Take by mouth.  . clonazePAM (KLONOPIN) 1 MG tablet Take 1 mg by mouth 3 (three) times daily as needed. For anxiety  . escitalopram (LEXAPRO) 10 MG tablet escitalopram 10 mg tablet  . metoprolol succinate (TOPROL-XL) 25 MG 24 hr tablet Take 25 mg by mouth daily.  . nitrofurantoin, macrocrystal-monohydrate, (MACROBID) 100 MG capsule TAKE 1 CAPSULE BY MOUTH WITH FOOD AS NEEDED AFTER INTERCOURSE  . omeprazole (PRILOSEC) 20 MG capsule Take 20 mg by mouth daily.  . valACYclovir (VALTREX) 500 MG tablet Take 500 mg by mouth daily.     Allergies:   Pantoprazole sodium   Social History   Socioeconomic History  . Marital status: Single    Spouse name: Not on file  . Number of children: Not on file  . Years of education: Not on file  . Highest education level: Not on file  Occupational History  . Not on file  Social Needs  . Financial resource strain: Not on file  . Food insecurity    Worry: Not on file    Inability:  Not on file  . Transportation needs    Medical: Not on file    Non-medical: Not on file  Tobacco Use  . Smoking status: Current Every Day Smoker    Packs/day: 1.00    Years: 10.00    Pack years: 10.00    Types: Cigarettes  . Smokeless tobacco: Never Used  . Tobacco comment: Quit 06/07/12.  using patches  Substance and Sexual Activity  . Alcohol use: Yes    Comment: daily- "really drunk yesterday"- 12/04/2011  . Drug use: No  . Sexual activity: Yes    Birth control/protection: Pill  Lifestyle  . Physical activity    Days per week: Not on file    Minutes per session: Not on file  . Stress: Not on file  Relationships  . Social Musicianconnections    Talks on phone: Not on file    Gets together: Not on file     Attends religious service: Not on file    Active member of club or organization: Not on file    Attends meetings of clubs or organizations: Not on file    Relationship status: Not on file  Other Topics Concern  . Not on file  Social History Narrative  . Not on file     Family History: The patient's family history includes Cancer in her maternal uncle; Hypertension in her father and maternal grandmother; Stroke in her father. There is no history of Anesthesia problems, Hypotension, Malignant hyperthermia, or Pseudochol deficiency.  ROS:   Please see the history of present illness.     All other systems reviewed and are negative.  EKGs/Labs/Other Studies Reviewed:    The following studies were reviewed today:   EKG:  EKG is  ordered today.  The ekg ordered today demonstrates normal sinus rhythm, rate 66, no ST/T abnormality  Recent Labs: No results found for requested labs within last 8760 hours.  Recent Lipid Panel    Component Value Date/Time   CHOL 166 07/30/2012 1608   TRIG 122 07/30/2012 1608   HDL 59 07/30/2012 1608   CHOLHDL 2.8 07/30/2012 1608   VLDL 24 07/30/2012 1608   LDLCALC 83 07/30/2012 1608    Physical Exam:    VS:  BP (!) 142/95 (BP Location: Right Arm, Patient Position: Standing, Cuff Size: Normal)   Pulse 75   Temp (!) 97.1 F (36.2 C)   Ht 5\' 3"  (1.6 m)   Wt 150 lb (68 kg)   SpO2 100%   BMI 26.57 kg/m     Wt Readings from Last 3 Encounters:  08/12/19 150 lb (68 kg)  07/25/17 115 lb (52.2 kg)  04/23/17 120 lb (54.4 kg)     GEN:  Well nourished, well developed in no acute distress HEENT: Normal NECK: No JVD LYMPHATICS: No lymphadenopathy CARDIAC: RRR, no murmurs, rubs, gallops RESPIRATORY:  Clear to auscultation without rales, wheezing or rhonchi  ABDOMEN: Soft, non-tender, non-distended MUSCULOSKELETAL:  No edema SKIN: Warm and dry NEUROLOGIC:  Alert and oriented x 3 PSYCHIATRIC:  Normal affect   ASSESSMENT:    1. Syncope and  collapse   2. Palpitations   3. Tobacco use    PLAN:    In order of problems listed above:  Near syncope: Description suggests vasovagal syncope, as reports prodromal symptoms preceding near syncopal episodes.  Recent episodes appear to be triggered by abdominal pain.  Will check TTE to rule out any structural heart disease.  Given reported palpitations, will also check Zio patch x2  weeks to evaluate for arrhythmia.  Palpitations: Check Zio patch as above  Tobacco use: Smoking 1 pack/day, has quit smoking cigarettes but now smoking about 1 cigar/day.  Patient was counseled on the risk of tobacco use and cessation strongly encouraged.  RTC in 2 months   Medication Adjustments/Labs and Tests Ordered: Current medicines are reviewed at length with the patient today.  Concerns regarding medicines are outlined above.  Orders Placed This Encounter  Procedures  . LONG TERM MONITOR (3-14 DAYS)  . EKG 12-Lead  . ECHOCARDIOGRAM COMPLETE   No orders of the defined types were placed in this encounter.   Patient Instructions  Medication Instructions:  Continue same medications   Lab Work: None ordered   Testing/Procedures: Schedule Echo Schedule 14 day ZIO Monitor  Follow-Up: At [facility], you and your health needs are our priority.  As part of our continuing mission to provide you with exceptional heart care, we have created designated Provider Care Teams.  These Care Teams include your primary Cardiologist (physician) and Advanced Practice Providers (APPs -  Physician Assistants and Nurse Practitioners) who all work together to provide you with the care you need, when you need it.  Your next appointment:  2 months   The format for your next appointment:  Office   Provider:  Dr.Krishay Faro        Signed, Donato Heinz, MD  08/12/2019 12:33 PM    [place]

## 2019-08-17 ENCOUNTER — Ambulatory Visit (HOSPITAL_COMMUNITY): Payer: 59 | Attending: Cardiovascular Disease

## 2019-08-17 ENCOUNTER — Other Ambulatory Visit: Payer: Self-pay

## 2019-08-17 DIAGNOSIS — R55 Syncope and collapse: Secondary | ICD-10-CM | POA: Diagnosis not present

## 2019-08-25 ENCOUNTER — Telehealth: Payer: Self-pay | Admitting: *Deleted

## 2019-08-25 NOTE — Telephone Encounter (Signed)
14 day ZIO XT long term holter monitor to be mailed to patients PO BOX on record.  Instructions reviewed briefly as they are included in the monitor kit.

## 2019-09-20 ENCOUNTER — Other Ambulatory Visit: Payer: Self-pay

## 2019-09-20 ENCOUNTER — Telehealth: Payer: Self-pay | Admitting: *Deleted

## 2019-09-20 ENCOUNTER — Ambulatory Visit (INDEPENDENT_AMBULATORY_CARE_PROVIDER_SITE_OTHER): Payer: 59

## 2019-09-20 DIAGNOSIS — R55 Syncope and collapse: Secondary | ICD-10-CM

## 2019-09-20 NOTE — Telephone Encounter (Signed)
Patients  ZIO XT long term holtermonitor fell off after 8-9 hours, tried to reapply but fell off again.  Does not want to repeat because it caused to to break out in a rash.  [employer] called to cancel charges on [id number]. Patient coming in this afternoon.  We will attempt with a [employer] monitor using bridge and sensitive skin electrodes.

## 2019-09-30 ENCOUNTER — Ambulatory Visit: Payer: 59 | Admitting: Cardiology

## 2019-10-27 NOTE — Progress Notes (Signed)
Cardiology Office Note:    Date:  10/28/2019   ID:  Nicole [name], DOB 07-21-1981, MRN [id number]  PCP:  Wilfrid Lund, PA  Cardiologist:  No primary care provider on file.  Electrophysiologist:  None   Referring MD: Wilfrid Lund, PA   No chief complaint on file.   History of Present Illness:    Nicole [name] is a 39 y.o. female with a hx of hypertension, GERD, IBS, anxiety who presents for follow-up.  She was initially seen on 08/12/19 for an evaluation of near syncope.  Reports that she has had episodes for years of feeling like he was going to pass out.  Often triggered by being in the heat.  Reports since January 2019 has had 3 episodes where she felt like she was going to pass out.  These episodes have been triggered by abdominal pain.  She states that she would get severe abdominal pain, and feels short of breath and lightheaded.  Felt like she was going to pass out but no actual syncopal episodes.  She will lie down and symptoms will resolve.  She denies any chest pain.  She was seen in the [facility] ED on 07/20/2019 following 1 of these episodes.  Work-up was unremarkable, felt she likely had vasovagal syncope.  She reports she does have episodes of palpitations but not associated with near syncopal episodes.  States that she gets palpitations at least once per month, usally resolves after few seconds but will sometimes have recurrent episodes.  TTE was done on 08/17/2019 which showed no abnormalities.  Heart monitor showed no significant abnormalities.  She continues to have near syncopal episodes, now occurring about once per month.  Occurs after eating.  States she will eat meal that then causes her to have either diarrhea or vomiting.  Will feel like she is about to pass out during these episodes.  She has been seen by gastroenterology, CT abdomen pelvis is planned.  She has not smoked cigarettes in nearly 1 year.  Smoking cigars, less than one per day.  She has cut back on  alcohol use, now drinking 2 nights per week, 1-2 drinks.    Past Medical History:  Diagnosis Date  . Anxiety   . Depression   . GERD (gastroesophageal reflux disease)   . Herpes   . Hypertension   . IBS (irritable bowel syndrome)   . Urinary tract infection    hx of UTIs    Past Surgical History:  Procedure Laterality Date  . CHOLECYSTECTOMY  12/05/2011   Procedure: LAPAROSCOPIC CHOLECYSTECTOMY;  Surgeon: Shelly Rubenstein, MD;  Location: [facility] OR;  Service: General;  Laterality: N/A;  . COLONOSCOPY      Current Medications: Current Meds  Medication Sig  . Amphetamine-Dextroamphetamine (ADDERALL PO) Take by mouth.  . Celecoxib (CELEBREX PO) Take by mouth.  . clonazePAM (KLONOPIN) 1 MG tablet Take 1 mg by mouth 3 (three) times daily as needed. For anxiety  . escitalopram (LEXAPRO) 10 MG tablet escitalopram 10 mg tablet  . esomeprazole (NEXIUM) 40 MG capsule Take 40 mg by mouth daily.  . metoprolol succinate (TOPROL-XL) 25 MG 24 hr tablet Take 25 mg by mouth daily.  . nitrofurantoin, macrocrystal-monohydrate, (MACROBID) 100 MG capsule TAKE 1 CAPSULE BY MOUTH WITH FOOD AS NEEDED AFTER INTERCOURSE  . valACYclovir (VALTREX) 500 MG tablet Take 500 mg by mouth daily.     Allergies:   Pantoprazole sodium   Social History   Socioeconomic History  .  Marital status: Single    Spouse name: Not on file  . Number of children: Not on file  . Years of education: Not on file  . Highest education level: Not on file  Occupational History  . Not on file  Tobacco Use  . Smoking status: Current Every Day Smoker    Packs/day: 1.00    Years: 10.00    Pack years: 10.00    Types: Cigarettes  . Smokeless tobacco: Never Used  . Tobacco comment: Quit 06/07/12.  using patches  Substance and Sexual Activity  . Alcohol use: Yes    Comment: daily- "really drunk yesterday"- 12/04/2011  . Drug use: No  . Sexual activity: Yes    Birth control/protection: Pill  Other Topics Concern  . Not on  file  Social History Narrative  . Not on file   Social Determinants of Health   Financial Resource Strain:   . Difficulty of Paying Living Expenses: Not on file  Food Insecurity:   . Worried About Charity fundraiser in the Last Year: Not on file  . Ran Out of Food in the Last Year: Not on file  Transportation Needs:   . Lack of Transportation (Medical): Not on file  . Lack of Transportation (Non-Medical): Not on file  Physical Activity:   . Days of Exercise per Week: Not on file  . Minutes of Exercise per Session: Not on file  Stress:   . Feeling of Stress : Not on file  Social Connections:   . Frequency of Communication with Friends and Family: Not on file  . Frequency of Social Gatherings with Friends and Family: Not on file  . Attends Religious Services: Not on file  . Active Member of Clubs or Organizations: Not on file  . Attends Archivist Meetings: Not on file  . Marital Status: Not on file     Family History: The patient's family history includes Cancer in her maternal uncle; Hypertension in her father and maternal grandmother; Stroke in her father. There is no history of Anesthesia problems, Hypotension, Malignant hyperthermia, or Pseudochol deficiency.  Father had CVA in 38s, valve replacement in 44s  ROS:   Please see the history of present illness.     All other systems reviewed and are negative.  EKGs/Labs/Other Studies Reviewed:    The following studies were reviewed today:   EKG:  EKG is  Not ordered today.  The ekg ordered 08/12/19 demonstrates normal sinus rhythm, rate 66, no ST/T abnormality  Recent Labs: No results found for requested labs within last 8760 hours.  Recent Lipid Panel    Component Value Date/Time   CHOL 166 07/30/2012 1608   TRIG 122 07/30/2012 1608   HDL 59 07/30/2012 1608   CHOLHDL 2.8 07/30/2012 1608   VLDL 24 07/30/2012 1608   LDLCALC 83 07/30/2012 1608   TTE 08/17/19:  1. Left ventricular ejection fraction, by  visual estimation, is 60 to 65%. The left ventricle has normal function. There is no left ventricular hypertrophy.  2. Normal GLS -21.4.  3. Global right ventricle has normal systolic function.The right ventricular size is normal. No increase in right ventricular wall thickness.  4. Left atrial size was normal.  5. Right atrial size was normal.  6. The mitral valve is normal in structure. Trace mitral valve regurgitation. No evidence of mitral stenosis.  7. The tricuspid valve is normal in structure. Tricuspid valve regurgitation is not demonstrated.  8. The aortic valve is normal in  structure. Aortic valve regurgitation is not visualized. No evidence of aortic valve sclerosis or stenosis.  9. The pulmonic valve was normal in structure. Pulmonic valve regurgitation is not visualized. 10. The inferior vena cava is normal in size with greater than 50% respiratory variability, suggesting right atrial pressure of 3 mmHg.  Cardiac monitor 09/20/19:   No significant arrhythmias detected   14 days of data recorded on monitor. Patient had a min HR of 38 bpm, max HR of 141 bpm, and avg HR of 75 bpm. Predominant underlying rhythm was Sinus Rhythm. No VT, atrial fibrillation, high degree block, or pauses noted. 10 occurrence of SVT, longest lasting 15 beats.  Isolated atrial ectop was rare (<1%).  Isolated ventricular ectopy was occasional (1.4%). There were 4 triggered events, which corresponded to sinus rhythm.   No significant arrhythmias detected.      Physical Exam:    VS:  BP 140/88   Pulse 75   Ht 5\' 3"  (1.6 m)   Wt 141 lb 9.6 oz (64.2 kg)   SpO2 97%   BMI 25.08 kg/m     Wt Readings from Last 3 Encounters:  10/28/19 141 lb 9.6 oz (64.2 kg)  08/12/19 150 lb (68 kg)  07/25/17 115 lb (52.2 kg)     GEN:  Well nourished, well developed in no acute distress HEENT: Normal NECK: No JVD LYMPHATICS: No lymphadenopathy CARDIAC: RRR, no murmurs, rubs, gallops RESPIRATORY:  Clear to  auscultation without rales, wheezing or rhonchi  ABDOMEN: Soft, non-tender, non-distended MUSCULOSKELETAL:  No edema SKIN: Warm and dry NEUROLOGIC:  Alert and oriented x 3 PSYCHIATRIC:  Normal affect   ASSESSMENT:    1. Syncope and collapse   2. Tobacco use    PLAN:    In order of problems listed above:  Near syncope: Description suggests vasovagal syncope, as reports prodromal symptoms preceding near syncopal episodes.  Appears to be triggered by abdominal pain.  No structural heart disease on TTE.  Zio patch showed no significant arrhythmia.   Tobacco use: Was smoking 1 pack/day, has quit smoking cigarettes but now smoking cigars, currently less than one per day.  Patient was counseled on the risk of tobacco use and cessation strongly encouraged.  RTC as needed   Medication Adjustments/Labs and Tests Ordered: Current medicines are reviewed at length with the patient today.  Concerns regarding medicines are outlined above.  No orders of the defined types were placed in this encounter.  No orders of the defined types were placed in this encounter.   Patient Instructions  Medication Instructions:  Your physician recommends that you continue on your current medications as directed. Please refer to the Current Medication list given to you today.   Labwork: -None  Testing/Procedures: -None  Follow-Up: Follow up as needed.   Any Other Special Instructions Will Be Listed Below (If Applicable).     If you need a refill on your cardiac medications before your next appointment, please call your pharmacy.      Signed, 07/27/17, MD  10/28/2019 10:31 AM    [facility]

## 2019-10-28 ENCOUNTER — Other Ambulatory Visit: Payer: Self-pay | Admitting: Physician Assistant

## 2019-10-28 ENCOUNTER — Encounter: Payer: Self-pay | Admitting: Cardiology

## 2019-10-28 ENCOUNTER — Other Ambulatory Visit: Payer: Self-pay

## 2019-10-28 ENCOUNTER — Ambulatory Visit (INDEPENDENT_AMBULATORY_CARE_PROVIDER_SITE_OTHER): Payer: 59 | Admitting: Cardiology

## 2019-10-28 VITALS — BP 140/88 | HR 75 | Ht 63.0 in | Wt 141.6 lb

## 2019-10-28 DIAGNOSIS — R55 Syncope and collapse: Secondary | ICD-10-CM | POA: Diagnosis not present

## 2019-10-28 DIAGNOSIS — R109 Unspecified abdominal pain: Secondary | ICD-10-CM

## 2019-10-28 DIAGNOSIS — Z720 Tobacco use: Secondary | ICD-10-CM

## 2019-10-28 DIAGNOSIS — R112 Nausea with vomiting, unspecified: Secondary | ICD-10-CM

## 2019-10-28 NOTE — Patient Instructions (Signed)
Medication Instructions:  Your physician recommends that you continue on your current medications as directed. Please refer to the Current Medication list given to you today.   Labwork: None.  Testing/Procedures: None.  Follow-Up: Follow up as needed.   Any Other Special Instructions Will Be Listed Below (If Applicable).     If you need a refill on your cardiac medications before your next appointment, please call your pharmacy.   

## 2019-11-01 ENCOUNTER — Ambulatory Visit
Admission: RE | Admit: 2019-11-01 | Discharge: 2019-11-01 | Disposition: A | Discharge status: Home / Self Care | Payer: 59 | Source: Ambulatory Visit | Attending: Physician Assistant | Admitting: Physician Assistant

## 2019-11-01 DIAGNOSIS — R112 Nausea with vomiting, unspecified: Secondary | ICD-10-CM

## 2019-11-01 DIAGNOSIS — R109 Unspecified abdominal pain: Secondary | ICD-10-CM

## 2019-11-01 MED ORDER — IOPAMIDOL (ISOVUE-300) INJECTION 61%
100.0000 mL | Freq: Once | INTRAVENOUS | Status: AC | PRN
  Administered 2019-11-01: 100 mL via INTRAVENOUS

## 2019-11-21 ENCOUNTER — Ambulatory Visit: Payer: BLUE CROSS/BLUE SHIELD | Admitting: Pediatrics

## 2019-11-24 NOTE — Progress Notes (Signed)
New Patient Note  RE: Nicole [name] MRN: [id number] DOB: 11-05-1980 Date of Office Visit: 11/25/2019  Referring provider: Matilde Haymaker, FNP Primary care provider: Wilfrid Lund, PA  Chief Complaint: Allergic Rhinitis  and Food Intolerance  History of Present Illness: I had the pleasure of seeing Nicole [name] for initial evaluation at the [facility] on 11/25/2019. She is a 40 y.o. female, who is referred here by Wilfrid Lund, PA for the evaluation of rhinitis and GI issues.   GI: Patient has been having issues with abdominal pains, diarrhea, nausea and vomiting. Symptoms have been going on for 16 years which has been worsening. No specific triggers noted but worse with dairy and greasy foods.  She only has symptoms of diarrhea after eating. Symptoms can persist for awhile.  The abdominal pains and nausea/vomiting sometimes occur without eating.   Patient has been evaluated by GI in the had multiple EGDs and colonoscopies and was diagnosed with IBS and GERD. She has tried multiple medications with no benefit.   Past work up includes: skin prick testing in her 14s was inconclusive.  Dietary History: patient has been eating other foods including limited milk, eggs, peanut, treenuts, sesame, shellfish, seafood, soy, wheat, meats, fruits and vegetables.  Rhinitis:  She reports symptoms of sneezing, watery eyes, rhinorrhea. Symptoms have been going on for many years. The symptoms are during the spring and fall. History of reflux: yes.  Assessment and Plan: [name] is a 39 y.o. female with: Nausea vomiting and diarrhea Worsening GI complaints (abdominal pain, nausea, vomiting, diarrhea) for the past 16 years with no clear trigger. She has been following with GI and had multiple EGDs and colonoscopies per patient report. Current diagnosis is IBS and GERD. Tried multiple meds with no benefit. Wants to rule out food allergies but has not been able to identify any  specific food triggers. Dairy and greasy foods do make the symptoms worse however she has GI symptoms even without consuming anything. The last few months she has not been eating as much and noted some weight loss. She does note significant stress in her personal life.   Today's skin testing showed: Positive to grass, weed, ragweed, trees, mold, dust mites. Borderline positive to seafood, almond, shrimp, [place], navy bean, mushrooms, bananas. These are foods that she does not eat frequently.   Start to avoid the above foods.  I don't think they are the major triggers for her GI issues and the IBS is probably what's contributing to her symptoms.   Keep a food journal.  Start zyrtec 10mg  daily.  Continue Pepcid 20mg  twice a day.  Continue to follow up with GI and PCP.  Limit alcohol and marijuana use.   Other allergic rhinitis Rhino conjunctivitis symptoms for many years with worsening symptoms in the spring and fall.   Today's skin testing showed: Positive to grass, weed, ragweed, trees, mold, dust mites.  Start environmental control measures.  Patient does not tolerate nasal sprays.   Start zyrtec 10mg  daily.   Urticaria History of hives with no triggers.  The zyrtec 10mg  daily as recommended above should help with this.  Will get bloodwork to rule out any other etiologies.  Return in about 4 weeks (around 12/23/2019).   Lab Orders     Alpha-Gal Panel     CBC with Differential/Platelet     Chronic Urticaria     Comprehensive metabolic panel     C-reactive protein  Tryptase     Thyroid Cascade Profile     Sedimentation rate     C3 and C4  Other allergy screening: Asthma: no  Using albuterol for bronchitis.  Rhino conjunctivitis: yes Medication allergy: no Hymenoptera allergy: no Urticaria: yes  Sometimes breaks out and worse with stress. Takes benadryl prn with good benefit. No recent bloodwork.  Eczema:no History of recurrent infections suggestive of  immunodeficency: no  Diagnostics: Skin Testing: Environmental allergy panel and food panel. Positive to grass, weed, ragweed, trees, mold, dust mites. Borderline positive to seafood, almond, shrimp, [place], navy bean, mushrooms, bananas. Results given. Results discussed with patient/family. Airborne Adult Perc - 11/25/19 1430    Time Antigen Placed  1425    Allergen Manufacturer  Lavella Hammock    Location  Back    Number of Test  59    1. Control-Buffer 50% Glycerol  Negative    2. Control-Histamine 1 mg/ml  2+    3. Albumin saline  Negative    4. [place]  Negative    5. [place]  Negative    7. [place] Blue  Negative    8. Meadow Fescue  2+    9. Perennial Rye  Negative    10. Sweet Vernal  Negative    11. Timothy  Negative    12. Cocklebur  Negative    13. Burweed Marshelder  Negative    14. Ragweed, short  Negative    15. Ragweed, Giant  2+    16. Plantain,  English  Negative    17. Lamb's Quarters  Negative    18. Sheep Sorrell  Negative    19. Rough Pigweed  Negative    20. Marsh Elder, Rough  Negative    21. Mugwort, Common  2+    22. Ash mix  Negative    23. Birch mix  2+    24. Beech American  Negative    25. Box, Elder  Negative    26. Cedar, red  Negative    27. Cottonwood, [place]  Negative    28. Elm mix  2+    29. Hickory mix  Negative    30. Maple mix  Negative    31. Oak, [place] mix  2+    32. Pecan Pollen  Negative    33. Pine mix  2+    34. Sycamore Eastern  2+    35. [place], Black Pollen  2+    36. Alternaria alternata  Negative    37. Cladosporium Herbarum  Negative    38. Aspergillus mix  2+    39. Penicillium mix  2+    40. Bipolaris sorokiniana (Helminthosporium)  Negative    41. Drechslera spicifera (Curvularia)  Negative    42. Mucor plumbeus  Negative    43. Fusarium moniliforme  2+    44. Aureobasidium pullulans (pullulara)  Negative    45. Rhizopus oryzae  Negative    46. Botrytis cinera  Negative    47. Epicoccum nigrum  Negative    48.  Phoma betae  2+    49. Candida Albicans  2+    50. Trichophyton mentagrophytes  Negative    51. Mite, D Farinae  5,000 AU/ml  2+    52. Mite, D Pteronyssinus  5,000 AU/ml  Negative    53. Cat Hair 10,000 BAU/ml  Negative    54.  Dog Epithelia  Negative    55. Mixed Feathers  Negative    56. Horse Epithelia  Negative  57. Cockroach, German  Negative    58. Mouse  Negative    59. Tobacco Leaf  Negative     Food Adult Perc - 11/25/19 1400    Time Antigen Placed  1425    Allergen Manufacturer  Waynette Buttery    Location  Back    Number of allergen test  72    1. Peanut  Negative    2. Soybean  Negative    3. Wheat  Negative    4. Sesame  Negative    5. Milk, cow  Negative    6. Egg White, Chicken  Negative    7. Casein  Negative    8. Shellfish Mix  Negative    9. Fish Mix  --   +/-   10. Cashew  Negative    11. Pecan Food  Negative    12. Walnut Food  Negative    13. Almond  --   +/-   14. Hazelnut  Negative    15. [place] nut  Negative    16. Coconut  Negative    17. Pistachio  Negative    18. Catfish  Negative    19. Bass  Negative    20. Trout  --   +/-   21. Tuna  --   +/-   22. Salmon  Negative    23. Flounder  Negative    24. Codfish  Negative    25. Shrimp  --   +/-   26. Crab  Negative    27. Lobster  Negative    28. Oyster  Negative    29. Scallops  Negative    30. Barley  Negative    31. Oat   Negative    32. Rye   Negative    33. Hops  Negative    34. Rice  Negative    35. Cottonseed  Negative    36. Saccharomyces Cerevisiae   Negative    37. Pork  Negative    38. [place] Meat  --   +/-   39. Chicken Meat  Negative    40. Beef  Negative    41. Lamb  Negative    42. Tomato  Negative    43. White Potato  Negative    44. Sweet Potato  Negative    45. Pea, Green/English  Negative    46. Navy Bean  --   +/-   47. Mushrooms  --   +/-   48. Avocado  Negative    49. Onion  Negative    50. Cabbage  Negative    51. Carrots  Negative    52. Celery   Negative    53. Corn  Negative    54. Cucumber  Negative    55. Grape (White seedless)  Negative    56. Orange   Negative    57. Banana  --   +/-   58. Apple  Negative    59. Peach  Negative    60. Strawberry  Negative    61. Cantaloupe  Negative    62. Watermelon  Negative    63. Pineapple  Negative    64. Chocolate/Cacao bean  Negative    65. Karaya Gum  Negative    66. Acacia (Arabic Gum)  Negative    67. Cinnamon  Negative    68. Nutmeg  Negative    69. Ginger  Negative    70. Garlic  Negative    71. Pepper, black  Negative    72. Mustard  Negative       Past Medical History: Patient Active Problem List   Diagnosis Date Noted   Other allergic rhinitis 11/25/2019   Urticaria 11/25/2019   Adverse food reaction 11/25/2019   Nausea vomiting and diarrhea 11/25/2019   Elevated liver enzymes 11/10/2012   IBS (irritable bowel syndrome) 11/10/2012   Influenza 10/27/2012   Routine general medical examination at a health care facility 07/30/2012   Community acquired pneumonia 03/15/2012   B12 deficiency 01/08/2012   Depression    GERD (gastroesophageal reflux disease)    Anxiety    Symptomatic cholelithiasis 11/11/2011   Past Medical History:  Diagnosis Date   Anxiety    Depression    GERD (gastroesophageal reflux disease)    Herpes    Hypertension    IBS (irritable bowel syndrome)    Urinary tract infection    hx of UTIs   Urticaria    Past Surgical History: Past Surgical History:  Procedure Laterality Date   CHOLECYSTECTOMY  12/05/2011   Procedure: LAPAROSCOPIC CHOLECYSTECTOMY;  Surgeon: Shelly Rubenstein, MD;  Location: [facility] OR;  Service: General;  Laterality: N/A;   COLONOSCOPY     Medication List:  Current Outpatient Medications  Medication Sig Dispense Refill   amphetamine-dextroamphetamine (ADDERALL) 15 MG tablet TAKE 1/2 TABLET TWICE A DAY 8AM AND 1PM FOR ADHD     clonazePAM (KLONOPIN) 1 MG tablet Take 1 mg by mouth 3 (three)  times daily as needed. For anxiety     escitalopram (LEXAPRO) 10 MG tablet escitalopram 10 mg tablet     esomeprazole (NEXIUM) 40 MG capsule Take 40 mg by mouth daily.     HYDROcodone-acetaminophen (NORCO/VICODIN) 5-325 MG tablet hydrocodone 5 mg-acetaminophen 325 mg tablet     ibuprofen (ADVIL) 800 MG tablet ibuprofen 800 mg tablet  TAKE 1 TABLET BY MOUTH 3 TIMES A DAY UNTIL FINISHED     metoprolol succinate (TOPROL-XL) 50 MG 24 hr tablet      metroNIDAZOLE (FLAGYL) 500 MG tablet Flagyl 500 mg tablet  Take 1 tablet twice a day by oral route for 7 days.     nitrofurantoin, macrocrystal-monohydrate, (MACROBID) 100 MG capsule TAKE 1 CAPSULE BY MOUTH WITH FOOD AS NEEDED AFTER INTERCOURSE 90 capsule 0   norethindrone (MICRONOR) 0.35 MG tablet norethindrone (contraceptive) 0.35 mg tablet  TAKE 1 TABLET BY MOUTH EVERY DAY     ondansetron (ZOFRAN-ODT) 8 MG disintegrating tablet PLACE 1 TABLET ON THE TONGUE EVERY 8 HOURS AS NEEDED FOR NAUSEA     valACYclovir (VALTREX) 500 MG tablet Take 500 mg by mouth daily.     No current facility-administered medications for this visit.   Allergies: No Known Allergies Social History: Social History   Socioeconomic History   Marital status: Single    Spouse name: Not on file   Number of children: Not on file   Years of education: Not on file   Highest education level: Not on file  Occupational History   Not on file  Tobacco Use   Smoking status: Former Smoker    Packs/day: 1.00    Years: 20.00    Pack years: 20.00    Types: Cigarettes    Quit date: 11/18/2018    Years since quitting: 1.0   Smokeless tobacco: Never Used   Tobacco comment: Quit 06/07/12.  using patches  Substance and Sexual Activity   Alcohol use: Yes    Comment: daily- "really drunk yesterday"- 12/04/2011   Drug use: No  Sexual activity: Yes    Birth control/protection: Pill  Other Topics Concern   Not on file  Social History Narrative   Not on file    Social Determinants of Health   Financial Resource Strain:    Difficulty of Paying Living Expenses: Not on file  Food Insecurity:    Worried About Running Out of Food in the Last Year: Not on file   Ran Out of Food in the Last Year: Not on file  Transportation Needs:    Lack of Transportation (Medical): Not on file   Lack of Transportation (Non-Medical): Not on file  Physical Activity:    Days of Exercise per Week: Not on file   Minutes of Exercise per Session: Not on file  Stress:    Feeling of Stress : Not on file  Social Connections:    Frequency of Communication with Friends and Family: Not on file   Frequency of Social Gatherings with Friends and Family: Not on file   Attends Religious Services: Not on file   Active Member of Clubs or Organizations: Not on file   Attends BankerClub or Organization Meetings: Not on file   Marital Status: Not on file   Lives in a 1510[address] year old home. Smoking: 1 pack per day x 20 yrs, quit about 1 year ago Occupation: Designer, jewelleryquality inspector  Environmental History: Water Damage/mildew in the house: no Engineer, civil (consulting)Carpet in the family room: no Carpet in the bedroom: no Heating: electric Cooling: central Pet: no  Family History: Family History  Problem Relation Age of Onset   Hypertension Father    Stroke Father    Cancer Maternal Uncle        esophageal   Hypertension Maternal Grandmother    Allergic rhinitis Son    Anesthesia problems Neg Hx    Hypotension Neg Hx    Malignant hyperthermia Neg Hx    Pseudochol deficiency Neg Hx    Angioedema Neg Hx    Asthma Neg Hx    Eczema Neg Hx    Immunodeficiency Neg Hx    Urticaria Neg Hx    Review of Systems  Constitutional: Positive for appetite change and unexpected weight change (lost 25 lbs in 3 weeks). Negative for chills and fever.  HENT: Negative for congestion and rhinorrhea.   Eyes: Negative for itching.  Respiratory: Negative for cough, chest tightness, shortness of  breath and wheezing.   Cardiovascular: Negative for chest pain.  Gastrointestinal: Positive for abdominal pain, diarrhea, nausea and vomiting.  Genitourinary: Negative for difficulty urinating.  Skin: Negative for rash.  Allergic/Immunologic: Positive for environmental allergies.  Neurological: Negative for headaches.   Objective: BP (!) 150/90 (BP Location: Left Arm, Patient Position: Sitting, Cuff Size: Normal)    Pulse 70    Temp 98.3 F (36.8 C) (Oral)    Resp 18    Ht 5\' 5"  (1.651 m)    Wt 140 lb 10.5 oz (63.8 kg)    SpO2 100%    BMI 23.41 kg/m  Body mass index is 23.41 kg/m. Physical Exam  Constitutional: She is oriented to person, place, and time. She appears well-developed and well-nourished.  HENT:  Head: Normocephalic and atraumatic.  Right Ear: External ear normal.  Left Ear: External ear normal.  Nose: Nose normal.  Mouth/Throat: Oropharynx is clear and moist.  Eyes: Conjunctivae and EOM are normal.  Cardiovascular: Normal rate, regular rhythm and normal heart sounds. Exam reveals no gallop and no friction rub.  No murmur heard. Pulmonary/Chest: Effort normal  and breath sounds normal. She has no wheezes. She has no rales.  Abdominal: Soft.  Musculoskeletal:     Cervical back: Neck supple.  Neurological: She is alert and oriented to person, place, and time.  Skin: Skin is warm. No rash noted.  Psychiatric: She has a normal mood and affect. Her behavior is normal.  Nursing note and vitals reviewed.  The plan was reviewed with the patient/family, and all questions/concerned were addressed.  It was my pleasure to see Nicole [name] today and participate in her care. Please feel free to contact me with any questions or concerns.  Sincerely,  Wyline Mood, DO Allergy & Immunology  [facility] office: [phone] [facility] office: [phone] [place] office: [phone]

## 2019-11-25 ENCOUNTER — Ambulatory Visit (INDEPENDENT_AMBULATORY_CARE_PROVIDER_SITE_OTHER): Payer: 59 | Admitting: Allergy

## 2019-11-25 ENCOUNTER — Other Ambulatory Visit: Payer: Self-pay

## 2019-11-25 ENCOUNTER — Encounter: Payer: Self-pay | Admitting: Allergy

## 2019-11-25 VITALS — BP 150/90 | HR 70 | Temp 98.3°F | Resp 18 | Ht 65.0 in | Wt 140.7 lb

## 2019-11-25 DIAGNOSIS — R112 Nausea with vomiting, unspecified: Secondary | ICD-10-CM

## 2019-11-25 DIAGNOSIS — R197 Diarrhea, unspecified: Secondary | ICD-10-CM

## 2019-11-25 DIAGNOSIS — T781XXA Other adverse food reactions, not elsewhere classified, initial encounter: Secondary | ICD-10-CM | POA: Insufficient documentation

## 2019-11-25 DIAGNOSIS — T781XXD Other adverse food reactions, not elsewhere classified, subsequent encounter: Secondary | ICD-10-CM

## 2019-11-25 DIAGNOSIS — L509 Urticaria, unspecified: Secondary | ICD-10-CM | POA: Insufficient documentation

## 2019-11-25 DIAGNOSIS — T7819XD Other adverse food reactions, not elsewhere classified, subsequent encounter: Secondary | ICD-10-CM

## 2019-11-25 DIAGNOSIS — J3089 Other allergic rhinitis: Secondary | ICD-10-CM

## 2019-11-25 DIAGNOSIS — K9049 Malabsorption due to intolerance, not elsewhere classified: Secondary | ICD-10-CM | POA: Insufficient documentation

## 2019-11-25 NOTE — Assessment & Plan Note (Addendum)
Worsening GI complaints (abdominal pain, nausea, vomiting, diarrhea) for the past 16 years with no clear trigger. She has been following with GI and had multiple EGDs and colonoscopies per patient report. Current diagnosis is IBS and GERD. Tried multiple meds with no benefit. Wants to rule out food allergies but has not been able to identify any specific food triggers. Dairy and greasy foods do make the symptoms worse however she has GI symptoms even without consuming anything. The last few months she has not been eating as much and noted some weight loss. She does note significant stress in her personal life.   Today's skin testing showed: Positive to grass, weed, ragweed, trees, mold, dust mites. Borderline positive to seafood, almond, shrimp, [place], navy bean, mushrooms, bananas. These are foods that she does not eat frequently.   Start to avoid the above foods.  I don't think they are the major triggers for her GI issues and the IBS is probably what's contributing to her symptoms.   Keep a food journal.  Start zyrtec 10mg  daily.  Continue Pepcid 20mg  twice a day.  Continue to follow up with GI and PCP.  Limit alcohol and marijuana use.

## 2019-11-25 NOTE — Assessment & Plan Note (Signed)
History of hives with no triggers.  The zyrtec 10mg  daily as recommended above should help with this.  Will get bloodwork to rule out any other etiologies.

## 2019-11-25 NOTE — Assessment & Plan Note (Signed)
Rhino conjunctivitis symptoms for many years with worsening symptoms in the spring and fall.   Today's skin testing showed: Positive to grass, weed, ragweed, trees, mold, dust mites.  Start environmental control measures.  Patient does not tolerate nasal sprays.   Start zyrtec 10mg  daily.

## 2019-11-25 NOTE — Patient Instructions (Addendum)
Today's skin testing showed: Positive to grass, weed, ragweed, trees, mold, dust mites. Borderline positive to seafood, almond, shrimp, [place], navy bean, mushrooms, bananas.   Start to avoid the above foods.  I don't think they are the major triggers for your GI issues.   Keep a food journal.  Start zyrtec 10mg  daily.  Continue Pepcid 20mg  twice a day. Get bloodwork:  We are ordering labs, so please allow 1-2 weeks for the results to come back. With the newly implemented Cures Act, the labs might be visible to you at the same time that they become visible to me. However, I will not address the results until all of the results are back, so please be patient.  In the meantime, continue recommendations in your patient instructions, including avoidance measures (if applicable), until you hear from me.  Environmental allergies:  Start environmental control measures.   Please follow up with GI and PCP regarding your abdominal issues.  Limit alcohol and marijuana use.   Follow up in 4 weeks or sooner if needed.   Reducing Pollen Exposure . Pollen seasons: trees (spring), grass (summer) and ragweed/weeds (fall). Marland Kitchen Keep windows closed in your home and car to lower pollen exposure.  Susa Simmonds air conditioning in the bedroom and throughout the house if possible.  . Avoid going out in dry windy days - especially early morning. . Pollen counts are highest between 5 - 10 AM and on dry, hot and windy days.  . Save outside activities for late afternoon or after a heavy rain, when pollen levels are lower.  . Avoid mowing of grass if you have grass pollen allergy. Marland Kitchen Be aware that pollen can also be transported indoors on people and pets.  . Dry your clothes in an automatic dryer rather than hanging them outside where they might collect pollen.  . Rinse hair and eyes before bedtime. Mold Control . Mold and fungi can grow on a variety of surfaces provided certain temperature and moisture  conditions exist.  . Outdoor molds grow on plants, decaying vegetation and soil. The major outdoor mold, Alternaria and Cladosporium, are found in very high numbers during hot and dry conditions. Generally, a late summer - fall peak is seen for common outdoor fungal spores. Rain will temporarily lower outdoor mold spore count, but counts rise rapidly when the rainy period ends. . The most important indoor molds are Aspergillus and Penicillium. Dark, humid and poorly ventilated basements are ideal sites for mold growth. The next most common sites of mold growth are the bathroom and the kitchen. Outdoor (Seasonal) Mold Control . Use air conditioning and keep windows closed. . Avoid exposure to decaying vegetation. Marland Kitchen Avoid leaf raking. . Avoid grain handling. . Consider wearing a face mask if working in moldy areas.  Indoor (Perennial) Mold Control  . Maintain humidity below 50%. . Get rid of mold growth on hard surfaces with water, detergent and, if necessary, 5% bleach (do not mix with other cleaners). Then dry the area completely. If mold covers an area more than 10 square feet, consider hiring an indoor environmental professional. . For clothing, washing with soap and water is best. If moldy items cannot be cleaned and dried, throw them away. . Remove sources e.g. contaminated carpets. . Repair and seal leaking roofs or pipes. Using dehumidifiers in damp basements may be helpful, but empty the water and clean units regularly to prevent mildew from forming. All rooms, especially basements, bathrooms and kitchens, require ventilation and cleaning to  deter mold and mildew growth. Avoid carpeting on concrete or damp floors, and storing items in damp areas. Control of House Dust Mite Allergen . Dust mite allergens are a common trigger of allergy and asthma symptoms. While they can be found throughout the house, these microscopic creatures thrive in warm, humid environments such as bedding, upholstered  furniture and carpeting. . Because so much time is spent in the bedroom, it is essential to reduce mite levels there.  . Encase pillows, mattresses, and box springs in special allergen-proof fabric covers or airtight, zippered plastic covers.  . Bedding should be washed weekly in hot water (130 F) and dried in a hot dryer. Allergen-proof covers are available for comforters and pillows that can't be regularly washed.  Reyes Ivan the allergy-proof covers every few months. Minimize clutter in the bedroom. Keep pets out of the bedroom.  Marland Kitchen Keep humidity less than 50% by using a dehumidifier or air conditioning. You can buy a humidity measuring device called a hygrometer to monitor this.  . If possible, replace carpets with hardwood, linoleum, or washable area rugs. If that's not possible, vacuum frequently with a vacuum that has a HEPA filter. . Remove all upholstered furniture and non-washable window drapes from the bedroom. . Remove all non-washable stuffed toys from the bedroom.  Wash stuffed toys weekly.

## 2019-12-15 ENCOUNTER — Telehealth: Payer: Self-pay | Admitting: Allergy

## 2019-12-15 NOTE — Telephone Encounter (Signed)
Left a message informing patient we are still waiting for results to come back and it can take up to two weeks to come back. I also let her know about the Cures Act and to bare with [place].

## 2019-12-15 NOTE — Telephone Encounter (Signed)
Message routed to Dr. Kim

## 2019-12-15 NOTE — Telephone Encounter (Signed)
I'm still waiting for some of her other labs to come back. Will send a mychart message once I have all the results back.   Per discharge instructions:  We are ordering labs, so please allow 1-2 weeks for the results to come back.  With the newly implemented Cures Act, the labs might be visible to you at the same time that they become visible to me. However, I will not address the results until all of the results are back, so please be patient.

## 2019-12-15 NOTE — Telephone Encounter (Signed)
Patient called in wanting to discuss her 2/26 lab results   Please advise

## 2019-12-30 ENCOUNTER — Ambulatory Visit: Payer: Managed Care, Other (non HMO) | Admitting: Allergy

## 2020-01-06 ENCOUNTER — Ambulatory Visit: Payer: Managed Care, Other (non HMO) | Admitting: Allergy

## 2020-01-16 LAB — CBC WITH DIFFERENTIAL/PLATELET
Basophils Absolute: 0.1 10*3/uL (ref 0.0–0.2)
Basos: 1 %
EOS (ABSOLUTE): 0.2 10*3/uL (ref 0.0–0.4)
Eos: 1 %
Hematocrit: 40.8 % (ref 34.0–46.6)
Hemoglobin: 14.6 g/dL (ref 11.1–15.9)
Immature Grans (Abs): 0 10*3/uL (ref 0.0–0.1)
Immature Granulocytes: 0 %
Lymphocytes Absolute: 3.2 10*3/uL — ABNORMAL HIGH (ref 0.7–3.1)
Lymphs: 25 %
MCH: 34.5 pg — ABNORMAL HIGH (ref 26.6–33.0)
MCHC: 35.8 g/dL — ABNORMAL HIGH (ref 31.5–35.7)
MCV: 97 fL (ref 79–97)
Monocytes Absolute: 0.8 10*3/uL (ref 0.1–0.9)
Monocytes: 6 %
Neutrophils Absolute: 8.3 10*3/uL — ABNORMAL HIGH (ref 1.4–7.0)
Neutrophils: 67 %
Platelets: 232 10*3/uL (ref 150–450)
RBC: 4.23 x10E6/uL (ref 3.77–5.28)
RDW: 13.2 % (ref 11.7–15.4)
WBC: 12.6 10*3/uL — ABNORMAL HIGH (ref 3.4–10.8)

## 2020-01-16 LAB — C3 AND C4
Complement C3, Serum: 104 mg/dL (ref 82–167)
Complement C4, Serum: 20 mg/dL (ref 12–38)

## 2020-01-16 LAB — COMPREHENSIVE METABOLIC PANEL
ALT: 22 IU/L (ref 0–32)
AST: 24 IU/L (ref 0–40)
Albumin/Globulin Ratio: 2 (ref 1.2–2.2)
Albumin: 4.3 g/dL (ref 3.8–4.8)
Alkaline Phosphatase: 113 IU/L (ref 39–117)
BUN/Creatinine Ratio: 5 — ABNORMAL LOW (ref 9–23)
BUN: 4 mg/dL — ABNORMAL LOW (ref 6–20)
Bilirubin Total: 0.3 mg/dL (ref 0.0–1.2)
CO2: 25 mmol/L (ref 20–29)
Calcium: 9.2 mg/dL (ref 8.7–10.2)
Chloride: 100 mmol/L (ref 96–106)
Creatinine, Ser: 0.8 mg/dL (ref 0.57–1.00)
GFR calc Af Amer: 107 mL/min/{1.73_m2} (ref >59)
GFR calc non Af Amer: 93 mL/min/{1.73_m2} (ref >59)
Globulin, Total: 2.2 g/dL (ref 1.5–4.5)
Glucose: 89 mg/dL (ref 65–99)
Potassium: 4 mmol/L (ref 3.5–5.2)
Sodium: 137 mmol/L (ref 134–144)
Total Protein: 6.5 g/dL (ref 6.0–8.5)

## 2020-01-16 LAB — T3FREE

## 2020-01-16 LAB — THYROID CASCADE PROFILE: TSH: 0.228 u[IU]/mL — ABNORMAL LOW (ref 0.450–4.500)

## 2020-01-16 LAB — ALPHA-GAL PANEL
Alpha Gal IgE*: <0.1 kU/L (ref <0.10)
Beef (Bos spp) IgE: <0.1 kU/L (ref <0.35)
Class Interpretation: 0
Class Interpretation: 0
Class Interpretation: 0
Lamb/Mutton (Ovis spp) IgE: <0.1 kU/L (ref <0.35)
Pork (Sus spp) IgE: <0.1 kU/L (ref <0.35)

## 2020-01-16 LAB — CHRONIC URTICARIA: cu index: 1.8 (ref <10)

## 2020-01-16 LAB — SEDIMENTATION RATE: Sed Rate: 7 mm/hr (ref 0–32)

## 2020-01-16 LAB — THYROXINE (T4) FREE, DIRECT: T4,Free (Direct): 1.05 ng/dL (ref 0.82–1.77)

## 2020-01-16 LAB — TRYPTASE: Tryptase: 3.3 ug/L (ref 2.2–13.2)

## 2020-01-16 LAB — C-REACTIVE PROTEIN: CRP: 4 mg/L (ref 0–10)

## 2020-01-30 IMAGING — CR DG STERNUM 2+V
2 series · 2 of 2 positions shown · non-contrast
Comparison: Chest radiographs, 03/13/2012

CLINICAL DATA: Hx of a fall on steps 5 days ago, pain mid sternum

EXAM:
STERNUM - 2+ VIEW

[w chest pa]
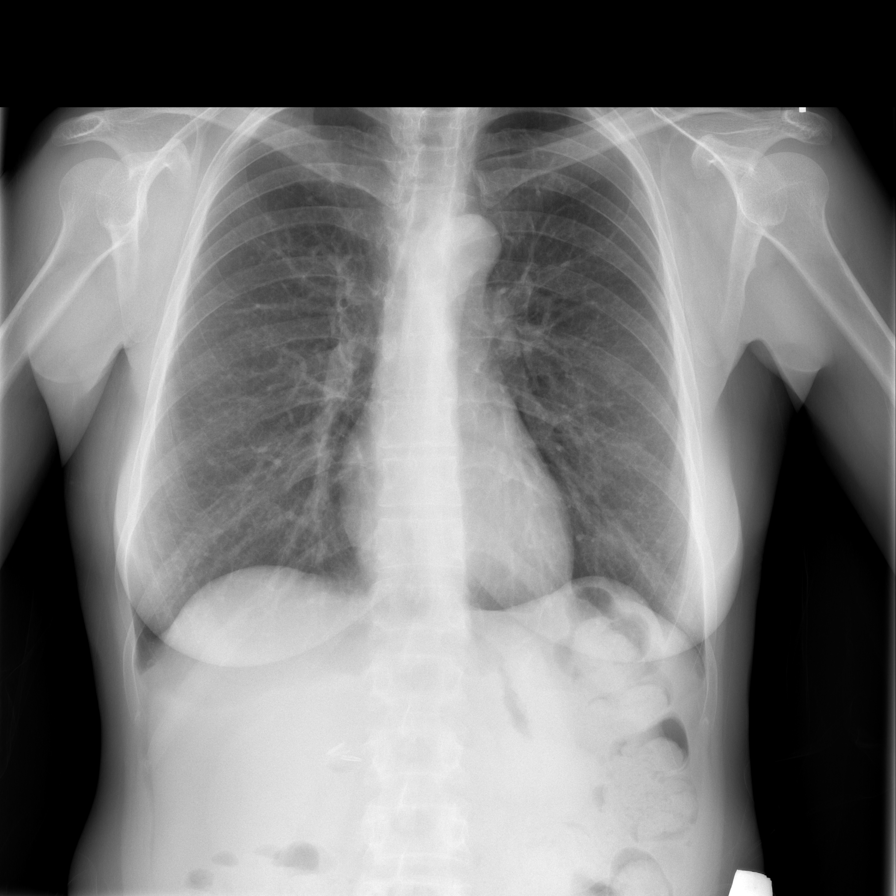

[w sternum lat *]
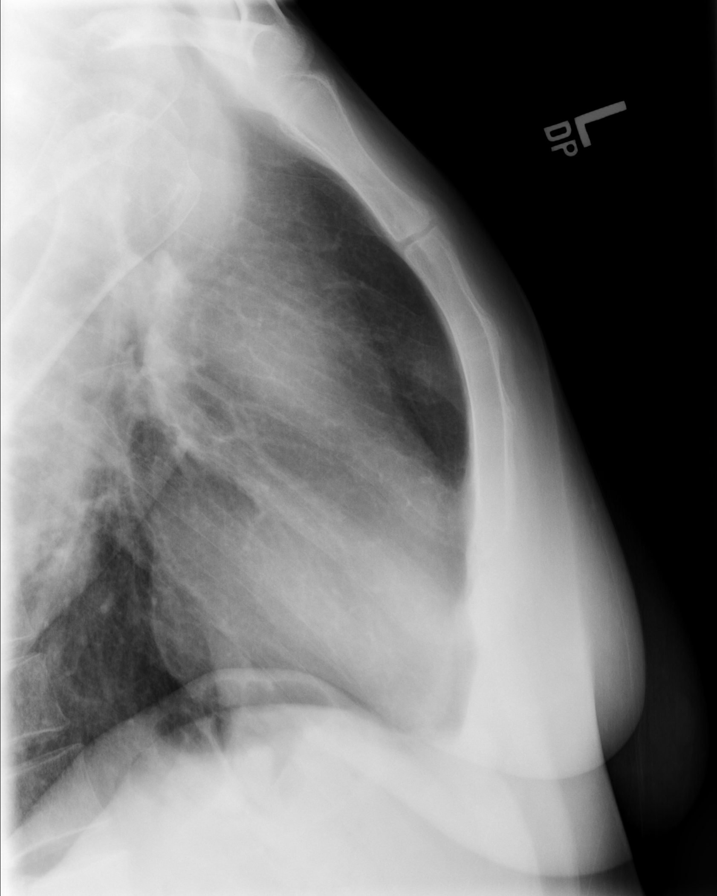

[2 of 2 positions shown; findings below may reference images not displayed]

FINDINGS: No sternal fracture. No lesion. Sternomanubrial joint is normally
spaced and aligned. The adjacent soft tissues are unremarkable.

Normal heart, mediastinum and hila. Clear lungs. No pleural effusion
or pneumothorax.
IMPRESSION: Negative.

## 2020-07-31 ENCOUNTER — Encounter: Payer: Self-pay | Admitting: Allergy and Immunology

## 2020-07-31 ENCOUNTER — Ambulatory Visit (INDEPENDENT_AMBULATORY_CARE_PROVIDER_SITE_OTHER): Payer: 59 | Admitting: Allergy and Immunology

## 2020-07-31 ENCOUNTER — Other Ambulatory Visit: Payer: Self-pay

## 2020-07-31 DIAGNOSIS — K9049 Malabsorption due to intolerance, not elsewhere classified: Secondary | ICD-10-CM

## 2020-07-31 DIAGNOSIS — H6983 Other specified disorders of Eustachian tube, bilateral: Secondary | ICD-10-CM | POA: Diagnosis not present

## 2020-07-31 DIAGNOSIS — J3089 Other allergic rhinitis: Secondary | ICD-10-CM

## 2020-07-31 DIAGNOSIS — H6980 Other specified disorders of Eustachian tube, unspecified ear: Secondary | ICD-10-CM | POA: Insufficient documentation

## 2020-07-31 MED ORDER — AZELASTINE HCL 0.1 % NA SOLN: NASAL | 5 refills | Status: AC

## 2020-07-31 NOTE — Assessment & Plan Note (Signed)
   Continue appropriate allergen avoidance measures.  Discontinue cetirizine as this may be contributing to mucus viscosity.  For thick post nasal drainage, nasal congestion, and/or sinus pressure, add guaifenesin 1200 mg (Mucinex Maximum Strength) plus/minus pseudoephedrine 120 mg  twice daily as needed with adequate hydration as discussed. Pseudoephedrine is only to be used for short-term relief of nasal/sinus congestion. Long-term use is discouraged due to potential side effects.  A prescription has been provided for azelastine nasal spray, 1-2 sprays per nostril 1-2 times daily as needed. Proper nasal spray technique has been discussed and demonstrated.   In addition to the azelastine, may use fluticasone nasal spray, 1 to 2 sprays per nostril daily as needed.  Nasal saline lavage (NeilMed) has been recommended as needed and prior to medicated nasal sprays along with instructions for proper administration.

## 2020-07-31 NOTE — Assessment & Plan Note (Addendum)
   Continue avoidance of foods as recommended by Dr. Selena Batten.  If symptoms recur keep a symptom/food journal.

## 2020-07-31 NOTE — Patient Instructions (Addendum)
Other allergic rhinitis  Continue appropriate allergen avoidance measures.  Discontinue cetirizine as this may be contributing to mucus viscosity.  For thick post nasal drainage, nasal congestion, and/or sinus pressure, add guaifenesin 1200 mg (Mucinex Maximum Strength) plus/minus pseudoephedrine 120 mg  twice daily as needed with adequate hydration as discussed. Pseudoephedrine is only to be used for short-term relief of nasal/sinus congestion. Long-term use is discouraged due to potential side effects.  A prescription has been provided for azelastine nasal spray, 1-2 sprays per nostril 1-2 times daily as needed. Proper nasal spray technique has been discussed and demonstrated.   In addition to the azelastine, may use fluticasone nasal spray, 1 to 2 sprays per nostril daily as needed.  Nasal saline lavage (NeilMed) has been recommended as needed and prior to medicated nasal sprays along with instructions for proper administration.  Eustachian tube dysfunction  Treatment plan as outlined above for allergic rhinitis.  If this problem persists and/or progresses, further evaluation by otolaryngology, Dr. Suszanne Conners, is recommended.  Food intolerance  Continue avoidance of foods as recommended by Dr. Selena Batten.  If symptoms recur keep a symptom/food journal.   Follow-up in 5 months or sooner if needed.

## 2020-07-31 NOTE — Progress Notes (Signed)
Follow-up Note  RE: Nicole [name] MRN: [id number] DOB: [birth year] Date of Office Visit: 07/31/2020  Primary care provider: Wilfrid Lund, PA Referring provider: Wilfrid Lund, PA  History of present illness: Nicole [name] is a 39 y.o. female with allergic rhinitis and food intolerance presenting today for follow-up.  She was previously seen in this clinic by Dr. Selena Batten on November 25, 2019.  Skin testing during her initial evaluation revealed reactivity to several foods.  She was advised to eliminate these foods from her diet which she has done and reports that her GI symptoms have improved.  However, she has been experiencing nasal congestion, frontal sinus pressure/headaches, ear pressure, and occasional dizziness.  She saw her primary care physician approximately 2 weeks ago and was told that there was fluid behind the ears.  She states that at times her postnasal drainage is so thick that it brings her to the point of vomiting.  She is currently taking Zyrtec-D twice daily and Flonase 1 spray per nostril once daily.  Assessment and plan: Other allergic rhinitis  Continue appropriate allergen avoidance measures.  Discontinue cetirizine as this may be contributing to mucus viscosity.  For thick post nasal drainage, nasal congestion, and/or sinus pressure, add guaifenesin 1200 mg (Mucinex Maximum Strength) plus/minus pseudoephedrine 120 mg  twice daily as needed with adequate hydration as discussed. Pseudoephedrine is only to be used for short-term relief of nasal/sinus congestion. Long-term use is discouraged due to potential side effects.  A prescription has been provided for azelastine nasal spray, 1-2 sprays per nostril 1-2 times daily as needed. Proper nasal spray technique has been discussed and demonstrated.   In addition to the azelastine, may use fluticasone nasal spray, 1 to 2 sprays per nostril daily as needed.  Nasal saline lavage (NeilMed) has been recommended as needed  and prior to medicated nasal sprays along with instructions for proper administration.  Eustachian tube dysfunction  Treatment plan as outlined above for allergic rhinitis.  If this problem persists and/or progresses, further evaluation by otolaryngology, Dr. Suszanne Conners, is recommended.  Food intolerance  Continue avoidance of foods as recommended by Dr. Selena Batten.  If symptoms recur keep a symptom/food journal.   Meds ordered this encounter  Medications  . azelastine (ASTELIN) 0.1 % nasal spray    Sig: Use 1-2 sprays per nostril 1-2 times daily as needed    Dispense:  30 mL    Refill:  5    Physical examination: Blood pressure 130/86, pulse 82, temperature (!) 97.4 F (36.3 C), temperature source Temporal, resp. rate 20, height 5\' 5"  (1.651 m), weight 135 lb 9.3 oz (61.5 kg), SpO2 100 %.  General: Alert, interactive, in no acute distress. HEENT: TMs pearly gray, turbinates moderately edematous without discharge, post-pharynx moderately erythematous. Neck: Supple without lymphadenopathy. Lungs: Clear to auscultation without wheezing, rhonchi or rales. CV: Normal S1, S2 without murmurs. Skin: Warm and dry, without lesions or rashes.  The following portions of the patient's history were reviewed and updated as appropriate: allergies, current medications, past family history, past medical history, past social history, past surgical history and problem list.  Current Outpatient Medications  Medication Sig Dispense Refill  . amphetamine-dextroamphetamine (ADDERALL) 15 MG tablet TAKE 1/2 TABLET TWICE A DAY 8AM AND 1PM FOR ADHD    . Cetirizine-Pseudoephedrine (ZYRTEC-D PO) Take by mouth in the morning and at bedtime.    . clonazePAM (KLONOPIN) 1 MG tablet Take 1 mg by mouth 3 (three) times daily as needed. For  anxiety    . esomeprazole (NEXIUM) 40 MG capsule Take 40 mg by mouth daily.    . fluticasone (FLONASE) 50 MCG/ACT nasal spray Place 1 spray into both nostrils daily.    Marland Kitchen ibuprofen  (ADVIL) 800 MG tablet ibuprofen 800 mg tablet  TAKE 1 TABLET BY MOUTH 3 TIMES A DAY UNTIL FINISHED    . levonorgestrel (MIRENA, 52 MG,) 20 MCG/24HR IUD Mirena 20 mcg/24 hours (7 yrs) 52 mg intrauterine device  Take 1 device by intrauterine route.    . metoprolol tartrate (LOPRESSOR) 25 MG tablet metoprolol tartrate 25 mg tablet  TAKE 1 TABLET BY MOUTH 2 TIMES DAILY    . nitrofurantoin, macrocrystal-monohydrate, (MACROBID) 100 MG capsule TAKE 1 CAPSULE BY MOUTH WITH FOOD AS NEEDED AFTER INTERCOURSE 90 capsule 0  . ondansetron (ZOFRAN-ODT) 8 MG disintegrating tablet PLACE 1 TABLET ON THE TONGUE EVERY 8 HOURS AS NEEDED FOR NAUSEA    . valACYclovir (VALTREX) 500 MG tablet Take 500 mg by mouth daily.    Marland Kitchen azelastine (ASTELIN) 0.1 % nasal spray Use 1-2 sprays per nostril 1-2 times daily as needed 30 mL 5   No current facility-administered medications for this visit.    No Known Allergies  Review of systems: Review of systems negative except as noted in HPI / PMHx.  Past Medical History:  Diagnosis Date  . Anxiety   . Depression   . GERD (gastroesophageal reflux disease)   . Herpes   . Hypertension   . IBS (irritable bowel syndrome)   . Urinary tract infection    hx of UTIs  . Urticaria     Family History  Problem Relation Age of Onset  . Hypertension Father   . Stroke Father   . Cancer Maternal Uncle        esophageal  . Hypertension Maternal Grandmother   . Allergic rhinitis Son   . Anesthesia problems Neg Hx   . Hypotension Neg Hx   . Malignant hyperthermia Neg Hx   . Pseudochol deficiency Neg Hx   . Angioedema Neg Hx   . Asthma Neg Hx   . Eczema Neg Hx   . Immunodeficiency Neg Hx   . Urticaria Neg Hx     Social History   Socioeconomic History  . Marital status: Single    Spouse name: Not on file  . Number of children: Not on file  . Years of education: Not on file  . Highest education level: Not on file  Occupational History  . Not on file  Tobacco Use  .  Smoking status: Former Smoker    Packs/day: 1.00    Years: 20.00    Pack years: 20.00    Types: Cigarettes    Quit date: 11/18/2018    Years since quitting: 1.7  . Smokeless tobacco: Never Used  . Tobacco comment: Quit 06/07/12.  using patches  Substance and Sexual Activity  . Alcohol use: Yes    Comment: daily- "really drunk yesterday"- 12/04/2011  . Drug use: No  . Sexual activity: Yes    Birth control/protection: Pill  Other Topics Concern  . Not on file  Social History Narrative  . Not on file   Social Determinants of Health   Financial Resource Strain:   . Difficulty of Paying Living Expenses: Not on file  Food Insecurity:   . Worried About Programme researcher, broadcasting/film/video in the Last Year: Not on file  . Ran Out of Food in the Last Year: Not on file  Transportation Needs:   . Freight forwarder (Medical): Not on file  . Lack of Transportation (Non-Medical): Not on file  Physical Activity:   . Days of Exercise per Week: Not on file  . Minutes of Exercise per Session: Not on file  Stress:   . Feeling of Stress : Not on file  Social Connections:   . Frequency of Communication with Friends and Family: Not on file  . Frequency of Social Gatherings with Friends and Family: Not on file  . Attends Religious Services: Not on file  . Active Member of Clubs or Organizations: Not on file  . Attends Banker Meetings: Not on file  . Marital Status: Not on file  Intimate Partner Violence:   . Fear of Current or Ex-Partner: Not on file  . Emotionally Abused: Not on file  . Physically Abused: Not on file  . Sexually Abused: Not on file    I appreciate the opportunity to take part in Rosetta's care. Please do not hesitate to contact me with questions.  Sincerely,   R. Jorene Guest, MD

## 2020-07-31 NOTE — Assessment & Plan Note (Signed)
   Treatment plan as outlined above for allergic rhinitis.  If this problem persists and/or progresses, further evaluation by otolaryngology, Dr. Suszanne Conners, is recommended.

## 2020-12-31 ENCOUNTER — Ambulatory Visit: Payer: 59 | Admitting: Allergy and Immunology

## 2020-12-31 ENCOUNTER — Ambulatory Visit: Payer: 59 | Admitting: Allergy & Immunology

## 2022-05-07 ENCOUNTER — Other Ambulatory Visit: Payer: Self-pay | Admitting: Gynecology

## 2022-05-07 DIAGNOSIS — R928 Other abnormal and inconclusive findings on diagnostic imaging of breast: Secondary | ICD-10-CM

## 2022-05-22 ENCOUNTER — Ambulatory Visit: Payer: 59

## 2022-05-22 ENCOUNTER — Ambulatory Visit
Admission: RE | Admit: 2022-05-22 | Discharge: 2022-05-22 | Disposition: A | Discharge status: Home / Self Care | Payer: Managed Care, Other (non HMO) | Source: Ambulatory Visit | Attending: Gynecology | Admitting: Gynecology

## 2022-05-22 DIAGNOSIS — R928 Other abnormal and inconclusive findings on diagnostic imaging of breast: Secondary | ICD-10-CM
